# Patient Record
Sex: Female | Born: 1970 | Race: White | Hispanic: No | Smoking: Never smoker
Health system: Southern US, Community
[De-identification: ages and names within clinical notes are randomized; demographics above are authoritative.]

---

## 2013-04-08 ENCOUNTER — Telehealth (INDEPENDENT_AMBULATORY_CARE_PROVIDER_SITE_OTHER): Payer: Self-pay | Admitting: Family Medicine

## 2013-04-08 ENCOUNTER — Ambulatory Visit (INDEPENDENT_AMBULATORY_CARE_PROVIDER_SITE_OTHER): Payer: BC Managed Care – PPO | Admitting: Family Medicine

## 2013-04-08 ENCOUNTER — Encounter (INDEPENDENT_AMBULATORY_CARE_PROVIDER_SITE_OTHER): Payer: Self-pay | Admitting: Family Medicine

## 2013-04-08 VITALS — BP 166/52 | HR 83 | Temp 98.8°F | Resp 20 | Ht 68.0 in | Wt 170.0 lb

## 2013-04-08 MED ORDER — ELETRIPTAN HYDROBROMIDE 40 MG PO TABS
40.0000 mg | ORAL_TABLET | Freq: Every day | ORAL | Status: DC | PRN
Start: 2013-04-08 — End: 2013-04-11

## 2013-04-08 MED ORDER — SUMATRIPTAN SUCCINATE 100 MG PO TABS
100.0000 mg | ORAL_TABLET | ORAL | Status: DC | PRN
Start: 2013-04-08 — End: 2013-04-08

## 2013-04-08 NOTE — Telephone Encounter (Signed)
See note

## 2013-04-08 NOTE — Progress Notes (Signed)
Since your last visit, have you seen any new specialist or physicians?    no

## 2013-04-08 NOTE — Telephone Encounter (Signed)
Message copied by Roger Kill on Tue Apr 08, 2013  4:31 PM  ------       Message from: Miguel Aschoff       Created: Tue Apr 08, 2013  4:20 PM       Regarding: Medication Changes       Contact: [phone]         Patient was seen today by Dr. Otilio Jefferson for migraines. The SUMAtriptan (IMITREX) 100 MG tablet medication she was given was $100+ dollars and she would like to know if there is a medication that is cheaper that she can get through her insurance.              Please advise              Call patient       [phone]

## 2013-04-08 NOTE — Progress Notes (Signed)
Subjective:       Patient ID: Beverly Fowler is a 42 y.o. female.      Chief Complaint   Patient presents with   . Migraine     1 month and a half, pain on front left side of head     Pt here with boyfriend.       Migraine   This is a recurrent problem. The current episode started more than 1 month ago. The problem occurs intermittently (3x/wk). The problem has been waxing and waning. The pain is located in the left unilateral region. The pain radiates to the left neck. The pain quality is similar to prior headaches. The quality of the pain is described as throbbing, aching and stabbing (ice pick behind eye). The pain is moderate. Associated symptoms include nausea, phonophobia and photophobia. Pertinent negatives include no abdominal pain, abnormal behavior, anorexia, back pain, blurred vision, coughing, dizziness, drainage, ear pain, eye pain, eye redness, eye watering, facial sweating, fever, hearing loss, insomnia, loss of balance, muscle aches, numbness, rhinorrhea, scalp tenderness, sinus pressure, sore throat, tingling, tinnitus, visual change, vomiting, weakness or weight loss. The symptoms are aggravated by bright light and activity. She has tried NSAIDs, cold packs and darkened room (ibuprofen 800mg  BID prn) for the symptoms. The treatment provided moderate relief. Her past medical history is significant for migraines in the family (son during HS). There is no history of hypertension, migraine headaches, recent head traumas, sinus disease or TMJ.   Missing work due to HA.  In October and November, missed 6 full days and late for about 6 days as well.       The following portions of the patient's history were reviewed and updated as appropriate: allergies, current medications, past family history, past medical history, past social history, past surgical history and problem list.    There is no problem list on file for this patient.    History reviewed. No pertinent past medical history.  Past Surgical  History   Procedure Date   . Cesarean section 1992     Family History   Problem Relation Age of Onset   . Hypertension Mother      History     Social History   . Marital Status: Divorced     Spouse Name: N/A     Number of Children: N/A   . Years of Education: N/A     Occupational History   . Not on file.     Social History Main Topics   . Smoking status: Never Smoker    . Smokeless tobacco: Never Used   . Alcohol Use: Yes      Comment: socially   . Drug Use: No   . Sexually Active: Not on file     Other Topics Concern   . Not on file     Social History Narrative   . No narrative on file     Current Outpatient Prescriptions   Medication Sig Dispense Refill   . ibuprofen (ADVIL) 200 MG tablet Take 200 mg by mouth every 6 (six) hours as needed.       . norethindrone-ethinyl estradiol (TRIPHASIL) 0.5/0.75/1-35 MG-MCG per tablet Take 1 tablet by mouth daily.         No Known Allergies      Review of Systems   Constitutional: Positive for activity change and appetite change. Negative for fever, chills, weight loss and fatigue.   HENT: Negative for congestion, ear pain, hearing loss, postnasal  drip, rhinorrhea, sinus pressure, sore throat, tinnitus and trouble swallowing.    Eyes: Positive for photophobia. Negative for blurred vision, pain, discharge, redness, itching and visual disturbance.   Respiratory: Negative for cough and shortness of breath.    Gastrointestinal: Positive for nausea. Negative for vomiting, abdominal pain, diarrhea and anorexia.   Musculoskeletal: Negative for back pain.   Neurological: Positive for headaches. Negative for dizziness, tingling, tremors, syncope, facial asymmetry, speech difficulty, weakness, light-headedness, numbness and loss of balance.   Psychiatric/Behavioral: Positive for sleep disturbance. The patient does not have insomnia.      Filed Vitals:    04/08/13 1432   BP: 166/52   Pulse: 83   Temp: 98.8 F (37.1 C)   TempSrc: Oral   Resp: 20   Height: 1.727 m (5\' 8" )   Weight: 77.111  kg (170 lb)   SpO2: 97%           Objective:    Physical Exam   Vitals reviewed.  Constitutional: She appears well-developed and well-nourished. She does not have a sickly appearance. She appears ill.   HENT:   Right Ear: External ear and ear canal normal. Tympanic membrane is not bulging. A middle ear effusion is present.   Left Ear: External ear and ear canal normal. Tympanic membrane is not bulging. A middle ear effusion is present.   Nose: No mucosal edema. Right sinus exhibits no maxillary sinus tenderness and no frontal sinus tenderness. Left sinus exhibits no maxillary sinus tenderness and no frontal sinus tenderness.   Mouth/Throat: Uvula is midline, oropharynx is clear and moist and mucous membranes are normal. No oropharyngeal exudate, posterior oropharyngeal edema or posterior oropharyngeal erythema.   Eyes: Conjunctivae normal, EOM and lids are normal. Pupils are equal, round, and reactive to light.   Neck: Trachea normal, normal range of motion and full passive range of motion without pain. Neck supple. Carotid bruit is not present. No Brudzinski's sign and no Kernig's sign noted. No mass and no thyromegaly present.   Cardiovascular: Normal rate, regular rhythm and normal heart sounds.    No murmur heard.  Pulmonary/Chest: Effort normal and breath sounds normal. No respiratory distress.   Musculoskeletal: Normal range of motion.   Lymphadenopathy:        Head (right side): No submental, no submandibular, no preauricular and no posterior auricular adenopathy present.        Head (left side): No submental, no submandibular, no preauricular and no posterior auricular adenopathy present.     She has no cervical adenopathy.   Neurological: She has normal strength. She displays no tremor. No cranial nerve deficit. She displays a negative Romberg sign. Coordination and gait normal. She displays no Babinski's sign on the right side. She displays no Babinski's sign on the left side.   Reflex Scores:       Tricep  reflexes are 2+ on the left side.       Bicep reflexes are 2+ on the right side and 2+ on the left side.       Brachioradialis reflexes are 2+ on the right side and 2+ on the left side.       Patellar reflexes are 2+ on the right side and 2+ on the left side.       Achilles reflexes are 2+ on the right side and 2+ on the left side.  Psychiatric: She has a normal mood and affect.           Assessment:  1. Migraine headache   SUMAtriptan (IMITREX) 100 MG tablet           Plan:       Likely migraine headache.  Declines anti-emetic.   Try triptan prn. Risk & Benefits of the new medication(s) were explained to the patient (and family) who verbalized understanding & agreed to the treatment plan. Patient (family) encouraged to contact me/clinical staff with any questions/concerns  Discussed s/sx's to watch out for that would warrant immediate re-evaluation.  FU if symptoms worsen/persist.   Monitor BP.  Pt w/o hx of elevated BP.   Pt says may need work form completed due to missed day from this recurrent headache.      Kaye Luoma T. Matilde Haymaker, MD

## 2013-04-08 NOTE — Telephone Encounter (Signed)
New rx sent for Relpax.  Might be cheaper.  She can also pick up a discount card that she can use for a $10 copay.

## 2013-04-09 ENCOUNTER — Encounter (INDEPENDENT_AMBULATORY_CARE_PROVIDER_SITE_OTHER): Payer: Self-pay | Admitting: Family Medicine

## 2013-04-09 NOTE — Telephone Encounter (Signed)
Called pt and told her about Rx being sent into pharmacy and discount card.  Pt would like to know if she would be able to get a discount card for the next time the Rx needs to be refilled and will it work every time she picks up her Rx?  Pleas advise.

## 2013-04-09 NOTE — Telephone Encounter (Signed)
There are different conditions for these kinds of savings cards, but usually cover for refills up to one year (once a month).

## 2013-04-09 NOTE — Telephone Encounter (Signed)
Called pt and pt has been informed of doctors note via VM.  Awaiting call back.

## 2013-04-10 ENCOUNTER — Encounter (INDEPENDENT_AMBULATORY_CARE_PROVIDER_SITE_OTHER): Payer: Self-pay | Admitting: Family Medicine

## 2013-04-10 NOTE — Telephone Encounter (Signed)
Called pt and pt complied with the research of obtained a medication formulary from her insurance.

## 2013-04-10 NOTE — Telephone Encounter (Signed)
Called pharmacy seeking to see if we can find out what Rx. Is paid for by insurance company.    1. Amatrix  2. Naratrittan  3. Resatriptin-magzolt

## 2013-04-10 NOTE — Telephone Encounter (Signed)
Advise pt to obtain her insurance drug formulary and find out what migraine meds are covered/preferred by her insurance, then let us know (she can list the meds in a MyChart message).  She can also ask her pharmacy to fax [place] a list of preferred meds dictated by her insurance company.   Please get PA form from her insurance for review and completion.

## 2013-04-10 NOTE — Telephone Encounter (Signed)
Insurance denied Rx because doctor needed to authorize that this medication is the only med that pt can take.  Pharmacy will not process Rx. Until doctor authorizes it. Discount card can not be used until authorization is competed by doctor.  Pharmacy states that they would reach out to doctor for authorization.  Insurance will only pay for 6 per month.

## 2013-04-10 NOTE — Telephone Encounter (Signed)
Called [facility] to start case on Rx. Relpax 40mg .  Case # [id number]

## 2013-04-11 ENCOUNTER — Encounter (INDEPENDENT_AMBULATORY_CARE_PROVIDER_SITE_OTHER): Payer: Self-pay | Admitting: Family Medicine

## 2013-04-11 ENCOUNTER — Telehealth (INDEPENDENT_AMBULATORY_CARE_PROVIDER_SITE_OTHER): Payer: Self-pay | Admitting: Family Medicine

## 2013-04-11 MED ORDER — NARATRIPTAN HCL 2.5 MG PO TABS
ORAL_TABLET | ORAL | Status: DC
Start: 2013-04-11 — End: 2013-04-14

## 2013-04-11 NOTE — Telephone Encounter (Signed)
Called pt and advised them of doctors results/notes.  Encounter closed.

## 2013-04-11 NOTE — Telephone Encounter (Signed)
Message copied by Roger Kill on Fri Apr 11, 2013  9:14 AM  ------       Message from: Lambert Keto PATRICI       Created: Tue Apr 08, 2013  6:09 PM         Please call pt on Friday, 04/11/13 to ask she is doing re: headache and med. Advise BP check as well since BP during OV elevated.

## 2013-04-11 NOTE — Telephone Encounter (Signed)
Called pt to ask how headaches are doing no answer.  Left message for pt to contact me back via my chart.

## 2013-04-11 NOTE — Telephone Encounter (Signed)
Pt still needing to start medication.   Just advise pt to send MyChart with update next wk.   May close this encounter.

## 2013-04-11 NOTE — Telephone Encounter (Signed)
Please inform pt that drug formulary reviewed.    Tier 1 meds include naratriptan and rizatriptan ODT (and zolmitrptan ODT but like would require trial of other meds first).  Will try naratriptan first.  Rx sent to pharmacy.

## 2013-04-11 NOTE — Telephone Encounter (Signed)
Called pt and advised them of doctors results/notes.

## 2013-04-14 MED ORDER — AMITRIPTYLINE HCL 25 MG PO TABS
25.0000 mg | ORAL_TABLET | Freq: Every evening | ORAL | Status: DC
Start: 2013-04-14 — End: 2013-05-07

## 2013-04-29 ENCOUNTER — Encounter (INDEPENDENT_AMBULATORY_CARE_PROVIDER_SITE_OTHER): Payer: Self-pay | Admitting: Family Medicine

## 2013-04-29 ENCOUNTER — Telehealth (INDEPENDENT_AMBULATORY_CARE_PROVIDER_SITE_OTHER): Payer: Self-pay | Admitting: Family Medicine

## 2013-04-29 MED ORDER — SUMATRIPTAN SUCCINATE 100 MG PO TABS
100.0000 mg | ORAL_TABLET | ORAL | Status: DC | PRN
Start: 2013-04-29 — End: 2013-05-19

## 2013-04-29 NOTE — Telephone Encounter (Signed)
Sent in sum[facility]riptan to [facility] as requested. Dr. Matilde Haymaker will return next week and she can discuss FMLA paperwork with her at that time.

## 2013-04-29 NOTE — Telephone Encounter (Signed)
Please see note below sent from pt my chart.        Prescription Question       Burna Forts       Sent: Tue April 29, 2013  1:05 PM     To: [facility]          ZULY BELKIN     MRN: [id number] DOB: 07-05-1970     Pt Home: [phone]         Entered: [phone]              Message      Hello,     After much leg work and trying to find a pharmacy that I can reasonably afford with the options that you have given to me... I may have found a solution.      I did not fill any prescription prior due to cost, however, [facility] in [place] is fairly reasonable for the Sumatriptan prescription that was originally written for me, If I recall it was Sumatriptan, 100mg , 10 tabs. If you would be so kind as to call this in to that pharmacy so that I can get this filled, I have had little relief with Excedrin migraine to get me through. ALSO, I would like to take a moment to speak with Dr. Matilde Haymaker briefly about a form that needs to be completed for my employer for FMLA/accomodations.            Thanks!!     Velna Hatchet

## 2013-04-29 NOTE — Telephone Encounter (Signed)
PT informed of doctors note.

## 2013-05-05 ENCOUNTER — Encounter (INDEPENDENT_AMBULATORY_CARE_PROVIDER_SITE_OTHER): Payer: Self-pay | Admitting: Family Medicine

## 2013-05-07 ENCOUNTER — Ambulatory Visit (INDEPENDENT_AMBULATORY_CARE_PROVIDER_SITE_OTHER): Payer: BC Managed Care – PPO | Admitting: Family Medicine

## 2013-05-07 ENCOUNTER — Encounter (INDEPENDENT_AMBULATORY_CARE_PROVIDER_SITE_OTHER): Payer: Self-pay | Admitting: Family Medicine

## 2013-05-07 VITALS — BP 134/90 | HR 72 | Temp 98.7°F | Ht 60.0 in | Wt 167.0 lb

## 2013-05-07 DIAGNOSIS — G43909 Migraine, unspecified, not intractable, without status migrainosus: Secondary | ICD-10-CM

## 2013-05-07 NOTE — Progress Notes (Signed)
Since your last visit, have you seen any new specialist or physicians?    no

## 2013-05-07 NOTE — Progress Notes (Signed)
Subjective:       Patient ID: Beverly Fowler is a 43 y.o. female.    Chief Complaint   Patient presents with   . Migraine     f/u         HPI    Pt here for FU of migraine headaches, took sumatriptan 5 days ago and had helped after 2 doses.    Needs work form completed.  Was missing work days due to headaches, sometimes tardy. When with headache, unable to think clearly, difficult to verbalize thoughts, slower mentally and physically.  Needs to close eyes and stay in a dark and quiet environment, and rest/sleep.    Currently, pt has not headache.       The following portions of the patient's history were reviewed and updated as appropriate: allergies, current medications, past medical history, past social history, past surgical history and problem list.    Review of Systems   Constitutional: Negative for activity change, appetite change and fatigue.   Eyes: Negative for visual disturbance.   Neurological: Negative for weakness, light-headedness, numbness and headaches.   Psychiatric/Behavioral: Negative for sleep disturbance.         Filed Vitals:    05/07/13 1405   BP: 134/90   Pulse: 72   Temp: 98.7 F (37.1 C)   TempSrc: Oral   Height: 1.524 m (5')   Weight: 75.751 kg (167 lb)           Objective:    Physical Exam   Vitals reviewed.  Constitutional: She appears well-developed and well-nourished. She does not appear ill.   Cardiovascular: Normal rate, regular rhythm and normal heart sounds.    No murmur heard.  Pulmonary/Chest: Effort normal and breath sounds normal. No respiratory distress.   Neurological: She is alert.   Psychiatric: She has a normal mood and affect. Her speech is normal and behavior is normal. Judgment normal.           Assessment:       1. Migraine headache            Plan:       Continue sumatriptan prn.  Discussed indications for prophylactic migraine tx.  Advised to keep a headache diary, to identify triggers.  Work form completed with pt. Returned to pt.     Jeremey Bascom T. Matilde Haymaker,  MD

## 2013-05-09 ENCOUNTER — Encounter (INDEPENDENT_AMBULATORY_CARE_PROVIDER_SITE_OTHER): Payer: Self-pay | Admitting: Family Medicine

## 2013-05-19 ENCOUNTER — Other Ambulatory Visit (INDEPENDENT_AMBULATORY_CARE_PROVIDER_SITE_OTHER): Payer: Self-pay | Admitting: Internal Medicine

## 2013-05-19 DIAGNOSIS — G43909 Migraine, unspecified, not intractable, without status migrainosus: Secondary | ICD-10-CM

## 2013-05-22 ENCOUNTER — Encounter (INDEPENDENT_AMBULATORY_CARE_PROVIDER_SITE_OTHER): Payer: Self-pay | Admitting: Family Medicine

## 2013-07-07 ENCOUNTER — Encounter (INDEPENDENT_AMBULATORY_CARE_PROVIDER_SITE_OTHER): Payer: BC Managed Care – PPO | Admitting: Obstetrics & Gynecology

## 2013-07-23 ENCOUNTER — Encounter (INDEPENDENT_AMBULATORY_CARE_PROVIDER_SITE_OTHER): Payer: BC Managed Care – PPO | Admitting: Obstetrics & Gynecology

## 2014-03-21 ENCOUNTER — Ambulatory Visit: Payer: Self-pay | Admitting: General Practice

## 2014-04-06 DIAGNOSIS — S83239A Complex tear of medial meniscus, current injury, unspecified knee, initial encounter: Secondary | ICD-10-CM | POA: Insufficient documentation

## 2014-04-06 DIAGNOSIS — S83249A Other tear of medial meniscus, current injury, unspecified knee, initial encounter: Secondary | ICD-10-CM | POA: Insufficient documentation

## 2014-04-14 ENCOUNTER — Ambulatory Visit: Payer: Self-pay | Admitting: Surgery

## 2014-04-15 DIAGNOSIS — M1710 Unilateral primary osteoarthritis, unspecified knee: Secondary | ICD-10-CM | POA: Insufficient documentation

## 2014-04-15 DIAGNOSIS — M179 Osteoarthritis of knee, unspecified: Secondary | ICD-10-CM | POA: Insufficient documentation

## 2014-08-26 NOTE — Op Note (Signed)
PATIENT NAME:  Alicia Dalton, Alicia Dalton MR#:  [id number] DATE OF BIRTH:  12/07/1970  DATE OF PROCEDURE:  04/14/2014  PREOPERATIVE DIAGNOSIS:  Medial meniscus tear right knee.    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    PROCEDURE:  Arthroscopic partial medial meniscectomy and abrasion chondroplasty of grade 2 chondromalacial changes of the patella.    SURGEON:  Maryagnes AmosJ. Jeffrey Dorotea Hand, MD.    ANESTHESIA:  General LMA.    FINDINGS:  As noted above.  There was Dalton complex tear of the posteromedial portion of the medial meniscus primarily involving the white-white zone and therefore un-repairable.  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  The anterior and posterior cruciate ligament were both in excellent condition.  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  The lateral compartment was in excellent condition.    COMPLICATIONS:  None.    ESTIMATED BLOOD LOSS:  Minimal.    TOTAL FLUIDS:  500 mL crystalloid.    TOURNIQUET:  None.    DRAINS:  None.    CLOSURE:  3-0 Prolene interrupted sutures.    BRIEF CLINICAL NOTE:  The patient is Dalton 44 year old female with Dalton several month history of medial-sided right knee pain.  Her symptoms have persisted despite medications, activity modification, etc.  Her history and examination were consistent with Dalton medial meniscus tear confirmed by MRI scan.  She presents at this time for arthroscopy and debridement versus repair of the medial meniscus tear.    DESCRIPTION OF PROCEDURE:  The patient was brought into the operating room and lain in the supine position.  After adequate general laryngeal mask anesthesia was obtained, the patient's right knee was injected sterilely using Dalton solution of 30 mL of 0.5% Marcaine with epinephrine and 30 mL of 1%  lidocaine.  The patient's right lower extremity was prepped with Chloraprep solution before being draped sterilely.  Preoperative antibiotics were administered.  The expected portal sites were injected with 1% lidocaine with epinephrine before the camera was placed in the anterolateral portal and instrumentation performed through the anteromedial portal.  The knee was sequentially examined beginning in the suprapatellar pouch, then progressing to the patellofemoral space, the medial gutter and compartment, the notch, and finally the lateral compartment and gutter.  The findings were as described above.  Some reactive synovial tissues anteriorly were debrided using the full radius resector in order to improve visualization.  The medial meniscus was carefully probed, confirming the above noted findings.  It was not deemed repairable as it was primarily in the white-white zone.  Therefore, the torn portion of meniscus was debrided back to stable margins using Dalton combination of the straight and up-biting small baskets and 3.5 mm full radius resector.  Subsequent probing of the remaining rim demonstrated good stability.  Laterally, the meniscus was notable for Dalton discoid configuration, but there were no tears, so nothing further was done with this meniscus.  There was an area of grade 2 chondromalacia involving the anterolateral portion of the medial femoral condyle which was slightly abraded back to stable margins using the full radius resector.  There also was more extensive grade 2 to 3 chondromalacia involving the central ridge of the patella which also was debrided back to stable margins using the full radius resector.  The instruments were removed from the joint after suctioning  the excess fluid.  The portal sites were reapproximated using 3-0 Prolene interrupted sutures.  Dalton sterile bulky dressing was applied to the knee before the patient was awakened, extubated, and returned to the recovery room in satisfactory  condition after tolerating the procedure well.     ____________________________ J. Derald Macleod, MD jjp:bu D: 04/14/2014 18:38:18 ET T: 04/14/2014 20:36:06 ET JOB#: [id number]  cc: Derryl Harbor, MD, <Dictator> JEFF Fidel Levy MD ELECTRONICALLY SIGNED 05/12/2014 11:29

## 2014-10-07 ENCOUNTER — Telehealth (INDEPENDENT_AMBULATORY_CARE_PROVIDER_SITE_OTHER): Payer: Self-pay | Admitting: Obstetrics and Gynecology

## 2014-10-07 NOTE — Telephone Encounter (Signed)
Younique I CALLED NUMBER BELOW AND IT NOT CORRECT NUMBER.  THIS CHART WE HAVE NEVER SEEN THIS PT EITHER.  NOT SURE IF YOU HAVE THE RIGHT CHART.

## 2014-10-07 NOTE — Telephone Encounter (Signed)
Oops wrong chart, so sorry,

## 2014-10-07 NOTE — Telephone Encounter (Signed)
Patient called please call her at [phone].  She said that her bleeding has started again and she is passing clots.

## 2015-05-24 ENCOUNTER — Ambulatory Visit (INDEPENDENT_AMBULATORY_CARE_PROVIDER_SITE_OTHER): Payer: BC Managed Care – PPO | Admitting: Physician Assistant

## 2015-05-24 ENCOUNTER — Encounter: Payer: Self-pay | Admitting: Physician Assistant

## 2015-05-24 VITALS — BP 112/60 | HR 64 | Temp 98.3°F | Resp 16 | Ht 60.0 in | Wt 189.2 lb

## 2015-05-24 DIAGNOSIS — G4700 Insomnia, unspecified: Secondary | ICD-10-CM | POA: Diagnosis not present

## 2015-05-24 DIAGNOSIS — F410 Panic disorder [episodic paroxysmal anxiety] without agoraphobia: Secondary | ICD-10-CM | POA: Diagnosis not present

## 2015-05-24 DIAGNOSIS — G43909 Migraine, unspecified, not intractable, without status migrainosus: Secondary | ICD-10-CM | POA: Insufficient documentation

## 2015-05-24 DIAGNOSIS — F419 Anxiety disorder, unspecified: Secondary | ICD-10-CM

## 2015-05-24 MED ORDER — ALPRAZOLAM 0.5 MG PO TABS: ORAL_TABLET | ORAL | Status: DC

## 2015-05-24 NOTE — Progress Notes (Signed)
Patient: Alicia Dalton, Female    DOB: 06-19-1970, 45 y.o.   MRN: [id number] Visit Date: 05/24/2015  Today's Provider: Margaretann Loveless, PA-C   Chief Complaint  Patient presents with  . Establish Care  . Panic Attack   Subjective:    Establish Care:  Alicia Dalton is a 45 y.o. female who presents today as a new patient to established care. She feels well. She reports exercising-elliptical 3-4 days a week . She reports she is sleeping poorly-stress, will like something to help her sleep better. Patient had her pap smear and mammogram May 2016 and Tetanus Oct. 2015. [facility] and [facility]. She got her Influenza Vaccine through work, works at [employer].  Patient states has been feeling anxious and nervous lately.  Anxiety: Patient complains of panic attacks.  She has the following symptoms: chest pain, difficulty concentrating, fatigue, insomnia, irritable, palpitations, racing thoughts, shortness of breath. Onset of symptoms was approximately 3 month ago, gradually worsening since that time. She denies current suicidal and homicidal ideation. Family history significant for substance abuse father is an alcoholic Risk factors: none Previous treatment includes none. She complains of the following side effects from the treatment: none. Per patient this is something new, She does not know how to explain. She has been feeling family stress. She states that her father was a terrible alcoholic and that she is estranged herself from her family secondary to this. She states that her current live-in boyfriend has started to drink more alcohol as well and this is caused a rift in their relationship. She states that he is not abusive but that when he drinks he can be belligerent. She states that this is causing her her most stress and anxiety as she never knows what she is going to be coming home to.   Review of Systems  Constitutional: Negative.   HENT: Negative.     Eyes: Negative.   Respiratory: Negative.   Cardiovascular: Negative.   Gastrointestinal: Negative.   Endocrine: Negative.   Genitourinary: Negative.   Musculoskeletal: Negative.   Skin: Negative.   Allergic/Immunologic: Negative.   Neurological: Negative.   Hematological: Negative.   Psychiatric/Behavioral: Positive for sleep disturbance, dysphoric mood and decreased concentration. Negative for suicidal ideas, self-injury and agitation. The patient is nervous/anxious.     Social History      She  reports that she has never smoked. She does not have any smokeless tobacco history on file. She reports that she drinks alcohol. She reports that she does not use illicit drugs.       Social History   Social History  . Marital Status: Divorced    Spouse Name: N/A  . Number of Children: N/A  . Years of Education: N/A   Social History Main Topics  . Smoking status: Never Smoker   . Smokeless tobacco: None  . Alcohol Use: Yes     Comment: 1-2 x wk  . Drug Use: No  . Sexual Activity: Yes   Other Topics Concern  . None   Social History Narrative  . None    History reviewed. No pertinent past medical history.   Patient Active Problem List   Diagnosis Date Noted  . Headache, migraine 05/24/2015  . Arthritis of knee, degenerative 04/15/2014  . Complex tear of medial meniscus as current injury 04/06/2014  . Current tear knee, medial meniscus 04/06/2014    History reviewed. No pertinent past surgical history.  Family History  Family Status  Relation Status Death Age  . Mother Alive   . Father Alive         Her family history includes Alcohol abuse in her father.    No Known Allergies  Previous Medications   NORETHINDRONE-ETHINYL ESTRADIOL 1/35 (NECON 1/35, 28,) TABLET       SUMATRIPTAN (IMITREX) 100 MG TABLET        Patient Care Team: Margaretann Loveless, PA-C as PCP - General (Family Medicine)     Objective:   Vitals: BP 112/60 mmHg  Pulse 64   Temp(Src) 98.3 F (36.8 C) (Oral)  Resp 16  Ht 5' (1.524 m)  Wt 189 lb 3.2 oz (85.821 kg)  BMI 36.95 kg/m2  LMP 05/07/2014   Physical Exam  Constitutional: She appears well-developed and well-nourished. No distress.  Cardiovascular: Normal rate, regular rhythm and normal heart sounds.  Exam reveals no gallop and no friction rub.   No murmur heard. Pulmonary/Chest: Effort normal and breath sounds normal. No respiratory distress. She has no wheezes. She has no rales.  Skin: She is not diaphoretic.  Psychiatric: Her speech is normal and behavior is normal. Judgment and thought content normal. Her mood appears anxious. Cognition and memory are normal. She exhibits a depressed mood.  Easily tearful  Vitals reviewed.    Depression Screen No flowsheet data found.    Assessment & Plan:     Routine Health Maintenance and Physical Exam  1. Acute anxiety Most likely secondary to situational anxiety associated with her live-in boyfriend's alcohol issue. I will give Xanax as below to help with sleep and for her to have if she needs during an acute panic attack. I will also check labs as below and follow-up with her pending these lab results. I have also made a referral to psychology as I feel she would benefit greatly from counseling and coping mechanisms. I will see her back in approximately 4 weeks to evaluate how she is doing at the time. She is to call the office if she has any worsening symptoms in the meantime. - Ambulatory referral to Psychology - ALPRAZolam (XANAX) 0.5 MG tablet; May take 1/2 tab to 1 tab PO BID prn for panic attack and take 1 tab PO q h.s. Prn sleep  Dispense: 60 tablet; Refill: 0 - CBC With Differential - Comprehensive Metabolic Panel (CMET) - TSH  2. Panic attacks See above medical treatment plan. - TSH  3. Insomnia See above medical treatment plan. - Comprehensive Metabolic Panel (CMET) - TSH   Exercise Activities and Dietary recommendations Goals     None       There is no immunization history on file for this patient.  Health Maintenance  Topic Date Due  . HIV Screening  03/17/1985  . TETANUS/TDAP  03/17/1989  . PAP SMEAR  03/18/1991  . INFLUENZA VACCINE  11/30/2015      Discussed health benefits of physical activity, and encouraged her to engage in regular exercise appropriate for her age and condition.    --------------------------------------------------------------------

## 2015-05-24 NOTE — Patient Instructions (Signed)
Alprazolam tablets What is this medicine? ALPRAZOLAM (al PRAY zoe lam) is a benzodiazepine. It is used to treat anxiety and panic attacks. This medicine may be used for other purposes; ask your health care provider or pharmacist if you have questions. What should I tell my health care provider before I take this medicine? They need to know if you have any of these conditions: -an alcohol or drug abuse problem -bipolar disorder, depression, psychosis or other mental health conditions -glaucoma -kidney or liver disease -lung or breathing disease -myasthenia gravis -Parkinson's disease -porphyria -seizures or a history of seizures -suicidal thoughts -an unusual or allergic reaction to alprazolam, other benzodiazepines, foods, dyes, or preservatives -pregnant or trying to get pregnant -breast-feeding How should I use this medicine? Take this medicine by mouth with a glass of water. Follow the directions on the prescription label. Take your medicine at regular intervals. Do not take it more often than directed. If you have been taking this medicine regularly for some time, do not suddenly stop taking it. You must gradually reduce the dose or you may get severe side effects. Ask your doctor or health care professional for advice. Even after you stop taking this medicine it can still affect your body for several days. Talk to your pediatrician regarding the use of this medicine in children. Special care may be needed. Overdosage: If you think you have taken too much of this medicine contact a poison control center or emergency room at once. NOTE: This medicine is only for you. Do not share this medicine with others. What if I miss a dose? If you miss a dose, take it as soon as you can. If it is almost time for your next dose, take only that dose. Do not take double or extra doses. What may interact with this medicine? Do not take this medicine with any of the following medications: -certain  medicines for HIV infection or AIDS -ketoconazole -itraconazole This medicine may also interact with the following medications: -birth control pills -certain macrolide antibiotics like clarithromycin, erythromycin, troleandomycin -cimetidine -cyclosporine -ergotamine -grapefruit juice -herbal or dietary supplements like kava kava, melatonin, dehydroepiandrosterone, DHEA, St. John's Wort or valerian -imatinib, STI-571 -isoniazid -levodopa -medicines for depression, anxiety, or psychotic disturbances -prescription pain medicines -rifampin, rifapentine, or rifabutin -some medicines for blood pressure or heart problems -some medicines for seizures like carbamazepine, oxcarbazepine, phenobarbital, phenytoin, primidone This list may not describe all possible interactions. Give your health care provider a list of all the medicines, herbs, non-prescription drugs, or dietary supplements you use. Also tell them if you smoke, drink alcohol, or use illegal drugs. Some items may interact with your medicine. What should I watch for while using this medicine? Visit your doctor or health care professional for regular checks on your progress. Your body can become dependent on this medicine. Ask your doctor or health care professional if you still need to take it. You may get drowsy or dizzy. Do not drive, use machinery, or do anything that needs mental alertness until you know how this medicine affects you. To reduce the risk of dizzy and fainting spells, do not stand or sit up quickly, especially if you are an older patient. Alcohol may increase dizziness and drowsiness. Avoid alcoholic drinks. Do not treat yourself for coughs, colds or allergies without asking your doctor or health care professional for advice. Some ingredients can increase possible side effects. What side effects may I notice from receiving this medicine? Side effects that you should report to your   doctor or health care professional as  soon as possible: -allergic reactions like skin rash, itching or hives, swelling of the face, lips, or tongue -confusion, forgetfulness -depression -difficulty sleeping -difficulty speaking -feeling faint or lightheaded, falls -mood changes, excitability or aggressive behavior -muscle cramps -trouble passing urine or change in the amount of urine -unusually weak or tired Side effects that usually do not require medical attention (report to your doctor or health care professional if they continue or are bothersome): -change in sex drive or performance -changes in appetite This list may not describe all possible side effects. Call your doctor for medical advice about side effects. You may report side effects to FDA at 1-800-FDA-1088. Where should I keep my medicine? Keep out of the reach of children. This medicine can be abused. Keep your medicine in a safe place to protect it from theft. Do not share this medicine with anyone. Selling or giving away this medicine is dangerous and against the law. Store at room temperature between 20 and 25 degrees C (68 and 77 degrees F). This medicine may cause accidental overdose and death if taken by other adults, children, or pets. Mix any unused medicine with a substance like cat litter or coffee grounds. Then throw the medicine away in a sealed container like a sealed bag or a coffee can with a lid. Do not use the medicine after the expiration date. NOTE: This sheet is a summary. It may not cover all possible information. If you have questions about this medicine, talk to your doctor, pharmacist, or health care provider.    2016, Elsevier/Gold Standard. (2014-01-06 14:51:36) Panic Attacks Panic attacks are sudden, short-livedsurges of severe anxiety, fear, or discomfort. They may occur for no reason when you are relaxed, when you are anxious, or when you are sleeping. Panic attacks may occur for a number of reasons:   Healthy people occasionally have  panic attacks in extreme, life-threatening situations, such as war or natural disasters. Normal anxiety is a protective mechanism of the body that helps [place] react to danger (fight or flight response).  Panic attacks are often seen with anxiety disorders, such as panic disorder, social anxiety disorder, generalized anxiety disorder, and phobias. Anxiety disorders cause excessive or uncontrollable anxiety. They may interfere with your relationships or other life activities.  Panic attacks are sometimes seen with other mental illnesses, such as depression and posttraumatic stress disorder.  Certain medical conditions, prescription medicines, and drugs of abuse can cause panic attacks. SYMPTOMS  Panic attacks start suddenly, peak within 20 minutes, and are accompanied by four or more of the following symptoms:  Pounding heart or fast heart rate (palpitations).  Sweating.  Trembling or shaking.  Shortness of breath or feeling smothered.  Feeling choked.  Chest pain or discomfort.  Nausea or strange feeling in your stomach.  Dizziness, light-headedness, or feeling like you will faint.  Chills or hot flushes.  Numbness or tingling in your lips or hands and feet.  Feeling that things are not real or feeling that you are not yourself.  Fear of losing control or going crazy.  Fear of dying. Some of these symptoms can mimic serious medical conditions. For example, you may think you are having a heart attack. Although panic attacks can be very scary, they are not life threatening. DIAGNOSIS  Panic attacks are diagnosed through an assessment by your health care provider. Your health care provider will ask questions about your symptoms, such as where and when they occurred. Your health care  provider will also ask about your medical history and use of alcohol and drugs, including prescription medicines. Your health care provider may order blood tests or other studies to rule out a serious  medical condition. Your health care provider may refer you to a mental health professional for further evaluation. TREATMENT   Most healthy people who have one or two panic attacks in an extreme, life-threatening situation will not require treatment.  The treatment for panic attacks associated with anxiety disorders or other mental illness typically involves counseling with a mental health professional, medicine, or a combination of both. Your health care provider will help determine what treatment is best for you.  Panic attacks due to physical illness usually go away with treatment of the illness. If prescription medicine is causing panic attacks, talk with your health care provider about stopping the medicine, decreasing the dose, or substituting another medicine.  Panic attacks due to alcohol or drug abuse go away with abstinence. Some adults need professional help in order to stop drinking or using drugs. HOME CARE INSTRUCTIONS   Take all medicines as directed by your health care provider.   Schedule and attend follow-up visits as directed by your health care provider. It is important to keep all your appointments. SEEK MEDICAL CARE IF:  You are not able to take your medicines as prescribed.  Your symptoms do not improve or get worse. SEEK IMMEDIATE MEDICAL CARE IF:   You experience panic attack symptoms that are different than your usual symptoms.  You have serious thoughts about hurting yourself or others.  You are taking medicine for panic attacks and have a serious side effect. MAKE SURE YOU:  Understand these instructions.  Will watch your condition.  Will get help right away if you are not doing well or get worse.   This information is not intended to replace advice given to you by your health care provider. Make sure you discuss any questions you have with your health care provider.   Document Released: 04/17/2005 Document Revised: 04/22/2013 Document Reviewed:  11/29/2012 Elsevier Interactive Patient Education [employer]

## 2015-05-25 ENCOUNTER — Telehealth: Payer: Self-pay

## 2015-05-25 LAB — COMPREHENSIVE METABOLIC PANEL
ALBUMIN: 4.3 g/dL (ref 3.5–5.5)
ALK PHOS: 78 IU/L (ref 39–117)
ALT: 18 IU/L (ref 0–32)
AST: 20 IU/L (ref 0–40)
Albumin/Globulin Ratio: 1.4 (ref 1.1–2.5)
BUN / CREAT RATIO: 14 (ref 9–23)
BUN: 11 mg/dL (ref 6–24)
CHLORIDE: 104 mmol/L (ref 96–106)
CO2: 21 mmol/L (ref 18–29)
Calcium: 9.5 mg/dL (ref 8.7–10.2)
Creatinine, Ser: 0.81 mg/dL (ref 0.57–1.00)
GFR calc Af Amer: 101 mL/min/{1.73_m2} (ref >59)
GFR calc non Af Amer: 88 mL/min/{1.73_m2} (ref >59)
GLUCOSE: 91 mg/dL (ref 65–99)
Globulin, Total: 3.1 g/dL (ref 1.5–4.5)
Potassium: 5.4 mmol/L — ABNORMAL HIGH (ref 3.5–5.2)
Sodium: 142 mmol/L (ref 134–144)
Total Protein: 7.4 g/dL (ref 6.0–8.5)

## 2015-05-25 LAB — CBC WITH DIFFERENTIAL
BASOS ABS: 0 10*3/uL (ref 0.0–0.2)
Basos: 0 %
EOS (ABSOLUTE): 0.1 10*3/uL (ref 0.0–0.4)
EOS: 1 %
HEMOGLOBIN: 13.4 g/dL (ref 11.1–15.9)
Hematocrit: 40 % (ref 34.0–46.6)
Immature Grans (Abs): 0 10*3/uL (ref 0.0–0.1)
Immature Granulocytes: 0 %
LYMPHS: 35 %
Lymphocytes Absolute: 3 10*3/uL (ref 0.7–3.1)
MCH: 30.8 pg (ref 26.6–33.0)
MCHC: 33.5 g/dL (ref 31.5–35.7)
MCV: 92 fL (ref 79–97)
MONOCYTES: 6 %
MONOS ABS: 0.5 10*3/uL (ref 0.1–0.9)
NEUTROS PCT: 58 %
Neutrophils Absolute: 4.8 10*3/uL (ref 1.4–7.0)
RBC: 4.35 x10E6/uL (ref 3.77–5.28)
RDW: 13.5 % (ref 12.3–15.4)
WBC: 8.4 10*3/uL (ref 3.4–10.8)

## 2015-05-25 LAB — TSH: TSH: 3.51 u[IU]/mL (ref 0.450–4.500)

## 2015-05-25 NOTE — Telephone Encounter (Signed)
-----   Message from Margaretann Loveless, PA-C sent at 05/25/2015  8:27 AM EST ----- All labs are within normal limits and stable.  Thanks! -JB

## 2015-05-25 NOTE — Telephone Encounter (Signed)
Pt advised.   Thanks.   -Alicia Dalton  

## 2015-06-09 ENCOUNTER — Ambulatory Visit: Payer: BC Managed Care – PPO | Admitting: Licensed Clinical Social Worker

## 2015-06-21 ENCOUNTER — Encounter: Payer: Self-pay | Admitting: Physician Assistant

## 2015-06-21 ENCOUNTER — Ambulatory Visit (INDEPENDENT_AMBULATORY_CARE_PROVIDER_SITE_OTHER): Payer: BC Managed Care – PPO | Admitting: Physician Assistant

## 2015-06-21 VITALS — BP 110/70 | Temp 98.4°F | Resp 16 | Wt 191.0 lb

## 2015-06-21 DIAGNOSIS — F411 Generalized anxiety disorder: Secondary | ICD-10-CM

## 2015-06-21 DIAGNOSIS — F32A Depression, unspecified: Secondary | ICD-10-CM

## 2015-06-21 DIAGNOSIS — F329 Major depressive disorder, single episode, unspecified: Secondary | ICD-10-CM

## 2015-06-21 MED ORDER — ALPRAZOLAM 1 MG PO TABS: 1.0000 mg | ORAL_TABLET | Freq: Three times a day (TID) | ORAL | Status: DC | PRN

## 2015-06-21 MED ORDER — CITALOPRAM HYDROBROMIDE 10 MG PO TABS: 10.0000 mg | ORAL_TABLET | Freq: Every day | ORAL | Status: DC

## 2015-06-21 NOTE — Progress Notes (Signed)
Patient: Alicia Dalton Female    DOB: 27-Feb-1971   45 y.o.   MRN: [id number] Visit Date: 06/21/2015  Today's Provider: Margaretann Loveless, PA-C   Chief Complaint  Patient presents with  . Follow-up    anxiety   Subjective:    HPI Anxiety: Patient is here for her 4 week follow-up acute anxiety.  She has the following symptoms: chest pain, difficulty concentrating, dizziness, fatigue, feelings of losing control, insomnia, irritable, palpitations, racing thoughts, shortness of breath. Onset of symptoms was approximately 1 day ago, gradually improving since that time, the Xanax is helping a little. Per patient 1/2 of the pills seem not to help much. She took a full one yesterday when she had a severe panic attak  . She denies current suicidal and homicidal ideation. Family history significant for no psychiatric illness.Risk factors: none. She complains of the following side effects from the treatment: makes her feel drowsy.. Patient was also referred to a psychologist on 01/23. She didn't go to the psychologist appointment. They called her from the psychologist office and told her that she needed to call her insurance to verify if the office visit was going to get cover.     No Known Allergies Previous Medications   ALPRAZOLAM (XANAX) 0.5 MG TABLET    May take 1/2 tab to 1 tab PO BID prn for panic attack and take 1 tab PO q h.s. Prn sleep   NORETHINDRONE-ETHINYL ESTRADIOL 1/35 (NECON 1/35, 28,) TABLET       SUMATRIPTAN (IMITREX) 100 MG TABLET        Review of Systems  Constitutional: Negative.   Respiratory: Negative.   Cardiovascular: Negative.  Negative for chest pain.  Gastrointestinal: Negative.   Psychiatric/Behavioral: Positive for dysphoric mood, decreased concentration and agitation (Just don't feel normal. Wants to feel normal again.). The patient is nervous/anxious.     Social History  Substance Use Topics  . Smoking status: Never Smoker   . Smokeless tobacco:  Not on file  . Alcohol Use: Yes     Comment: 1-2 x wk   Objective:   BP 110/70 mmHg  Temp(Src) 98.4 F (36.9 C) (Oral)  Resp 16  Wt 191 lb (86.637 kg)  LMP 06/11/2015  Physical Exam  Constitutional: She appears well-developed and well-nourished. No distress.  Cardiovascular: Normal rate, regular rhythm and normal heart sounds.  Exam reveals no gallop and no friction rub.   No murmur heard. Pulmonary/Chest: Effort normal and breath sounds normal. No respiratory distress. She has no wheezes. She has no rales.  Skin: She is not diaphoretic.  Psychiatric: Her speech is normal and behavior is normal. Judgment and thought content normal. Her mood appears anxious. Cognition and memory are normal. She exhibits a depressed mood.  Very tearful at times during exam, questioning will she ever feel normal again  Vitals reviewed.       Assessment & Plan:     1. Generalized anxiety disorder Symptoms not well controlled. Will increase xanax to  but for her to cut in half for the majority of the time and take one whole pill during the bad panic attacks.  She is going to call to get back in touch with [facility] for counseling. She will call if unable to get this rescheduled. Will add celexa for better daily control. She is to take this at night before bedtime. I will see her back in 4 weeks to re-evaluate. She is to call the office  if she has any adverse reactions to the medication, develops anyy questions or concerns.  - citalopram (CELEXA) 10 MG tablet; Take 1 tablet (10 mg total) by mouth daily.  Dispense: 30 tablet; Refill: 0 - ALPRAZolam (XANAX) 1 MG tablet; Take 1 tablet (1 mg total) by mouth 3 (three) times daily as needed for anxiety.  Dispense: 60 tablet; Refill: 0       Margaretann Loveless, PA-C  [facility]

## 2015-06-21 NOTE — Patient Instructions (Signed)
Citalopram tablets What is this medicine? CITALOPRAM (sye TAL oh pram) is a medicine for depression. This medicine may be used for other purposes; ask your health care provider or pharmacist if you have questions. What should I tell my health care provider before I take this medicine? They need to know if you have any of these conditions: -bipolar disorder or a family history of bipolar disorder -diabetes -glaucoma -heart disease -history of irregular heartbeat -kidney or liver disease -low levels of magnesium or potassium in the blood -receiving electroconvulsive therapy -seizures (convulsions) -suicidal thoughts or a previous suicide attempt -an unusual or allergic reaction to citalopram, escitalopram, other medicines, foods, dyes, or preservatives -pregnant or trying to become pregnant -breast-feeding How should I use this medicine? Take this medicine by mouth with a glass of water. Follow the directions on the prescription label. You can take it with or without food. Take your medicine at regular intervals. Do not take your medicine more often than directed. Do not stop taking this medicine suddenly except upon the advice of your doctor. Stopping this medicine too quickly may cause serious side effects or your condition may worsen. A special MedGuide will be given to you by the pharmacist with each prescription and refill. Be sure to read this information carefully each time. Talk to your pediatrician regarding the use of this medicine in children. Special care may be needed. Patients over 60 years old may have a stronger reaction and need a smaller dose. Overdosage: If you think you have taken too much of this medicine contact a poison control center or emergency room at once. NOTE: This medicine is only for you. Do not share this medicine with others. What if I miss a dose? If you miss a dose, take it as soon as you can. If it is almost time for your next dose, take only that dose.  Do not take double or extra doses. What may interact with this medicine? Do not take this medicine with any of the following medications: -certain medicines for fungal infections like fluconazole, itraconazole, ketoconazole, posaconazole, voriconazole -cisapride -dofetilide -dronedarone -escitalopram -linezolid -MAOIs like Carbex, Eldepryl, Marplan, Nardil, and Parnate -methylene blue (injected into a vein) -pimozide -thioridazine -ziprasidone This medicine may also interact with the following medications: -alcohol -aspirin and aspirin-like medicines -carbamazepine -certain medicines for depression, anxiety, or psychotic disturbances -certain medicines for infections like chloroquine, clarithromycin, erythromycin, furazolidone, isoniazid, pentamidine -certain medicines for migraine headaches like almotriptan, eletriptan, frovatriptan, naratriptan, rizatriptan, sumatriptan, zolmitriptan -certain medicines for sleep -certain medicines that treat or prevent blood clots like dalteparin, enoxaparin, warfarin -cimetidine -diuretics -fentanyl -lithium -methadone -metoprolol -NSAIDs, medicines for pain and inflammation, like ibuprofen or naproxen -omeprazole -other medicines that prolong the QT interval (cause an abnormal heart rhythm) -procarbazine -rasagiline -supplements like St. John's wort, kava kava, valerian -tramadol -tryptophan This list may not describe all possible interactions. Give your health care provider a list of all the medicines, herbs, non-prescription drugs, or dietary supplements you use. Also tell them if you smoke, drink alcohol, or use illegal drugs. Some items may interact with your medicine. What should I watch for while using this medicine? Tell your doctor if your symptoms do not get better or if they get worse. Visit your doctor or health care professional for regular checks on your progress. Because it may take several weeks to see the full effects of  this medicine, it is important to continue your treatment as prescribed by your doctor. Patients and their families should watch out   for new or worsening thoughts of suicide or depression. Also watch out for sudden changes in feelings such as feeling anxious, agitated, panicky, irritable, hostile, aggressive, impulsive, severely restless, overly excited and hyperactive, or not being able to sleep. If this happens, especially at the beginning of treatment or after a change in dose, call your health care professional. You may get drowsy or dizzy. Do not drive, use machinery, or do anything that needs mental alertness until you know how this medicine affects you. Do not stand or sit up quickly, especially if you are an older patient. This reduces the risk of dizzy or fainting spells. Alcohol may interfere with the effect of this medicine. Avoid alcoholic drinks. Your mouth may get dry. Chewing sugarless gum or sucking hard candy, and drinking plenty of water will help. Contact your doctor if the problem does not go away or is severe. What side effects may I notice from receiving this medicine? Side effects that you should report to your doctor or health care professional as soon as possible: -allergic reactions like skin rash, itching or hives, swelling of the face, lips, or tongue -chest pain -confusion -dizziness -fast, irregular heartbeat -fast talking and excited feelings or actions that are out of control -feeling faint or lightheaded, falls -hallucination, loss of contact with reality -seizures -shortness of breath -suicidal thoughts or other mood changes -unusual bleeding or bruising Side effects that usually do not require medical attention (report to your doctor or health care professional if they continue or are bothersome): -blurred vision -change in appetite -change in sex drive or performance -headache -increased sweating -nausea -trouble sleeping This list may not describe all  possible side effects. Call your doctor for medical advice about side effects. You may report side effects to FDA at 1-800-FDA-1088. Where should I keep my medicine? Keep out of reach of children. Store at room temperature between 15 and 30 degrees C (59 and 86 degrees F). Throw away any unused medicine after the expiration date. NOTE: This sheet is a summary. It may not cover all possible information. If you have questions about this medicine, talk to your doctor, pharmacist, or health care provider.    2016, Elsevier/Gold Standard. (2012-11-08 13:19:48)  

## 2015-06-24 ENCOUNTER — Encounter: Payer: Self-pay | Admitting: Physician Assistant

## 2015-07-19 ENCOUNTER — Ambulatory Visit: Payer: Self-pay | Admitting: Physician Assistant

## 2015-07-20 ENCOUNTER — Encounter: Payer: Self-pay | Admitting: Physician Assistant

## 2015-07-20 ENCOUNTER — Ambulatory Visit (INDEPENDENT_AMBULATORY_CARE_PROVIDER_SITE_OTHER): Payer: BC Managed Care – PPO | Admitting: Physician Assistant

## 2015-07-20 VITALS — BP 100/60 | HR 68 | Temp 98.3°F | Resp 16 | Wt 187.4 lb

## 2015-07-20 DIAGNOSIS — F411 Generalized anxiety disorder: Secondary | ICD-10-CM

## 2015-07-20 MED ORDER — CITALOPRAM HYDROBROMIDE 10 MG PO TABS: 10.0000 mg | ORAL_TABLET | Freq: Every day | ORAL | Status: DC

## 2015-07-20 MED ORDER — ALPRAZOLAM 1 MG PO TABS: 1.0000 mg | ORAL_TABLET | Freq: Three times a day (TID) | ORAL | Status: DC | PRN

## 2015-07-20 NOTE — Patient Instructions (Signed)
Generalized Anxiety Disorder Generalized anxiety disorder (GAD) is a mental disorder. It interferes with life functions, including relationships, work, and school. GAD is different from normal anxiety, which everyone experiences at some point in their lives in response to specific life events and activities. Normal anxiety actually helps us prepare for and get through these life events and activities. Normal anxiety goes away after the event or activity is over.  GAD causes anxiety that is not necessarily related to specific events or activities. It also causes excess anxiety in proportion to specific events or activities. The anxiety associated with GAD is also difficult to control. GAD can vary from mild to severe. People with severe GAD can have intense waves of anxiety with physical symptoms (panic attacks).  SYMPTOMS The anxiety and worry associated with GAD are difficult to control. This anxiety and worry are related to many life events and activities and also occur more days than not for 6 months or longer. People with GAD also have three or more of the following symptoms (one or more in children):  Restlessness.   Fatigue.  Difficulty concentrating.   Irritability.  Muscle tension.  Difficulty sleeping or unsatisfying sleep. DIAGNOSIS GAD is diagnosed through an assessment by your health care provider. Your health care provider will ask you questions aboutyour mood,physical symptoms, and events in your life. Your health care provider may ask you about your medical history and use of alcohol or drugs, including prescription medicines. Your health care provider may also do a physical exam and blood tests. Certain medical conditions and the use of certain substances can cause symptoms similar to those associated with GAD. Your health care provider may refer you to a mental health specialist for further evaluation. TREATMENT The following therapies are usually used to treat GAD:    Medication. Antidepressant medication usually is prescribed for long-term daily control. Antianxiety medicines may be added in severe cases, especially when panic attacks occur.   Talk therapy (psychotherapy). Certain types of talk therapy can be helpful in treating GAD by providing support, education, and guidance. A form of talk therapy called cognitive behavioral therapy can teach you healthy ways to think about and react to daily life events and activities.  Stress managementtechniques. These include yoga, meditation, and exercise and can be very helpful when they are practiced regularly. A mental health specialist can help determine which treatment is best for you. Some people see improvement with one therapy. However, other people require a combination of therapies.   This information is not intended to replace advice given to you by your health care provider. Make sure you discuss any questions you have with your health care provider.   Document Released: 08/12/2012 Document Revised: 05/08/2014 Document Reviewed: 08/12/2012 Elsevier Interactive Patient Education 2016 Elsevier Inc.  

## 2015-07-20 NOTE — Progress Notes (Signed)
Patient: Alicia Dalton Female    DOB: 06/14/1970   45 y.o.   MRN: [id number] Visit Date: 07/20/2015  Today's Provider: Margaretann Loveless, PA-C   Chief Complaint  Patient presents with  . Follow-up    Anxiety   Subjective:    HPI  Alicia Dalton is here for her 4 week follow-up. In the last office visit her symptoms were not controlled. Xanax was increased to 1 mg but to cut in half for the majority of time and to take 1 whole pill during a panic attacks. She was also prescribed Celexa. Patient was instructed to take at bedtime but she was waking up feeling very nauseated, light headed. Antony Contras advised patient through email to cut the pill in half for a couple of days to see if symptoms continue. She reports she is doing much better. Since the last office visit the panic attacks were two weeks ago. She feel she is able to controll the panic attacks better than before. She was having them more frequently but not any more. She does reports feeling tired and feels foggy. She usually starts with half of the pill but most of the time she needs to take the whole pill. Feels her concentration is still the same. She states that now she taking the whole pill of Celexa, after she did what Antony Contras advised her. Her symptoms got better. She also exercises 5 times a week. She started two weeks ago and reports that is helping her lot to focus and feel better in a way.     No Known Allergies Previous Medications   ALPRAZOLAM (XANAX) 1 MG TABLET    Take 1 tablet (1 mg total) by mouth 3 (three) times daily as needed for anxiety.   CITALOPRAM (CELEXA) 10 MG TABLET    Take 1 tablet (10 mg total) by mouth daily.   NORETHINDRONE-ETHINYL ESTRADIOL 1/35 (NECON 1/35, 28,) TABLET       SUMATRIPTAN (IMITREX) 100 MG TABLET        Review of Systems  Constitutional: Positive for fatigue (a lot).  Cardiovascular: Negative for chest pain, palpitations and leg swelling.  Neurological: Negative for  dizziness, light-headedness and headaches.  Psychiatric/Behavioral: Positive for sleep disturbance (sometimes.) and decreased concentration. Negative for suicidal ideas, dysphoric mood and agitation. The patient is nervous/anxious (much better.).     Social History  Substance Use Topics  . Smoking status: Never Smoker   . Smokeless tobacco: Not on file  . Alcohol Use: Yes     Comment: 1-2 x wk   Objective:   BP 100/60 mmHg  Pulse 68  Temp(Src) 98.3 F (36.8 C) (Oral)  Resp 16  Wt 187 lb 6.4 oz (85.004 kg)  LMP 07/11/2015  Physical Exam  Constitutional: She appears well-developed and well-nourished. No distress.  Cardiovascular: Normal rate, regular rhythm and normal heart sounds.  Exam reveals no gallop and no friction rub.   No murmur heard. Pulmonary/Chest: Effort normal and breath sounds normal. No respiratory distress. She has no wheezes. She has no rales.  Skin: She is not diaphoretic.  Psychiatric: She has a normal mood and affect. Her behavior is normal. Judgment and thought content normal.  Much improved today  Vitals reviewed.       Assessment & Plan:     1. Generalized anxiety disorder Improving. Will continue current medical treatment plan.  Medications refilled as below. She will switch celexa to take in the morning to see if that  helps with her drowsiness. I will see her back in 4 weeks if she continues to have the drowsiness even after the change.  If drowsiness continues we will consider switching medication. If drowsiness improves she may cancel the 4 week appointment and then I will see her back in 3 months.  - ALPRAZolam (XANAX) 1 MG tablet; Take 1 tablet (1 mg total) by mouth 3 (three) times daily as needed for anxiety.  Dispense: 60 tablet; Refill: 0 - citalopram (CELEXA) 10 MG tablet; Take 1 tablet (10 mg total) by mouth daily.  Dispense: 30 tablet; Refill: 3       Margaretann LovelessJennifer M Burnette, PA-C  [facility]

## 2015-07-30 ENCOUNTER — Encounter: Payer: Self-pay | Admitting: Physician Assistant

## 2015-08-17 ENCOUNTER — Encounter: Payer: Self-pay | Admitting: Physician Assistant

## 2015-08-19 ENCOUNTER — Ambulatory Visit: Payer: Self-pay | Admitting: Physician Assistant

## 2015-08-24 ENCOUNTER — Encounter: Payer: Self-pay | Admitting: Physician Assistant

## 2015-09-03 ENCOUNTER — Ambulatory Visit (INDEPENDENT_AMBULATORY_CARE_PROVIDER_SITE_OTHER): Payer: BC Managed Care – PPO | Admitting: Physician Assistant

## 2015-09-03 ENCOUNTER — Encounter: Payer: Self-pay | Admitting: Physician Assistant

## 2015-09-03 VITALS — BP 142/72 | HR 78 | Temp 98.1°F | Resp 14 | Wt 174.0 lb

## 2015-09-03 DIAGNOSIS — F419 Anxiety disorder, unspecified: Secondary | ICD-10-CM | POA: Diagnosis not present

## 2015-09-03 MED ORDER — ESCITALOPRAM OXALATE 10 MG PO TABS: 10.0000 mg | ORAL_TABLET | Freq: Every day | ORAL | Status: DC

## 2015-09-03 MED ORDER — LORAZEPAM 0.5 MG PO TABS: 0.5000 mg | ORAL_TABLET | Freq: Two times a day (BID) | ORAL | Status: DC | PRN

## 2015-09-03 NOTE — Progress Notes (Signed)
Patient ID: Alicia Dalton, female   DOB: 05-28-1970, 45 y.o.   MRN: [id number]       Patient: Alicia Dalton Female    DOB: [birth year]   45 y.o.   MRN: [id number] Visit Date: 09/03/2015  Today's Provider: Margaretann Loveless, PA-C   Chief Complaint  Patient presents with  . Anxiety   Subjective:    HPI Pt is here today for a follow up of anxiety. She reports that she has stopped taking the Celexa and is taking Xanax at least once every night. Depending on the day and her anxiety level and what she has going on she will take it more. She reports that she feels like the Xanax takes too long to calm her down. She reports that her blood pressure has never been as high as it is today. (she may need it checked later in the visit).  She is also seeing her Ob/Gyn today for her CPE and is going to ask them to check her hormone levels as she feels this may be early menopause symptoms.     No Known Allergies Previous Medications   ALPRAZOLAM (XANAX) 1 MG TABLET    Take 1 tablet (1 mg total) by mouth 3 (three) times daily as needed for anxiety.   CITALOPRAM (CELEXA) 10 MG TABLET    Take 1 tablet (10 mg total) by mouth daily.   NORETHINDRONE-ETHINYL ESTRADIOL 1/35 (NECON 1/35, 28,) TABLET       SUMATRIPTAN (IMITREX) 100 MG TABLET        Review of Systems  Constitutional: Negative.   HENT: Negative.   Eyes: Negative.   Respiratory: Negative.   Cardiovascular: Negative.   Gastrointestinal: Negative.   Endocrine: Negative.   Genitourinary: Negative.   Musculoskeletal: Negative.   Skin: Negative.   Allergic/Immunologic: Negative.   Neurological: Negative.   Hematological: Negative.   Psychiatric/Behavioral: Positive for dysphoric mood, decreased concentration and agitation. The patient is nervous/anxious.     Social History  Substance Use Topics  . Smoking status: Never Smoker   . Smokeless tobacco: Not on file  . Alcohol Use: Yes     Comment: 1-2 x wk-- none right now.      Objective:   BP 142/72 mmHg  Pulse 78  Temp(Src) 98.1 F (36.7 C) (Oral)  Resp 14  Wt 174 lb (78.926 kg)  Physical Exam  Constitutional: She appears well-developed and well-nourished. No distress.  Neck: Normal range of motion. Neck supple.  Cardiovascular: Normal rate, regular rhythm and normal heart sounds.  Exam reveals no gallop and no friction rub.   No murmur heard. Pulmonary/Chest: Effort normal and breath sounds normal. No respiratory distress. She has no wheezes. She has no rales.  Skin: She is not diaphoretic.  Psychiatric: Her speech is normal and behavior is normal. Judgment and thought content normal.  Frustrated and emotional because she "just feels crazy"  Vitals reviewed.       Assessment & Plan:     1. Acute anxiety Will switch from celexa and xanax to lexapro and ativan as below. She is getting her labs through her Ob/Gyn and will call us with those results. I will see her back in 4 weeks to see how she is doing and see if any improvement with these new medications. - LORazepam (ATIVAN) 0.5 MG tablet; Take 1 tablet (0.5 mg total) by mouth 2 (two) times daily as needed for anxiety.  Dispense: 60 tablet; Refill: 1 - escitalopram (LEXAPRO) 10 MG tablet;  Take 1 tablet (10 mg total) by mouth daily. Take 1/2 tab PO x 1 week, may increase to 1 tab after 1st week if needed  Dispense: 30 tablet; Refill: 1'      Margaretann LovelessJennifer M Alden Feagan, PA-C  [facility]

## 2015-09-03 NOTE — Patient Instructions (Signed)
Generalized Anxiety Disorder Generalized anxiety disorder (GAD) is a mental disorder. It interferes with life functions, including relationships, work, and school. GAD is different from normal anxiety, which everyone experiences at some point in their lives in response to specific life events and activities. Normal anxiety actually helps us prepare for and get through these life events and activities. Normal anxiety goes away after the event or activity is over.  GAD causes anxiety that is not necessarily related to specific events or activities. It also causes excess anxiety in proportion to specific events or activities. The anxiety associated with GAD is also difficult to control. GAD can vary from mild to severe. People with severe GAD can have intense waves of anxiety with physical symptoms (panic attacks).  SYMPTOMS The anxiety and worry associated with GAD are difficult to control. This anxiety and worry are related to many life events and activities and also occur more days than not for 6 months or longer. People with GAD also have three or more of the following symptoms (one or more in children):  Restlessness.   Fatigue.  Difficulty concentrating.   Irritability.  Muscle tension.  Difficulty sleeping or unsatisfying sleep. DIAGNOSIS GAD is diagnosed through an assessment by your health care provider. Your health care provider will ask you questions aboutyour mood,physical symptoms, and events in your life. Your health care provider may ask you about your medical history and use of alcohol or drugs, including prescription medicines. Your health care provider may also do a physical exam and blood tests. Certain medical conditions and the use of certain substances can cause symptoms similar to those associated with GAD. Your health care provider may refer you to a mental health specialist for further evaluation. TREATMENT The following therapies are usually used to treat GAD:    Medication. Antidepressant medication usually is prescribed for long-term daily control. Antianxiety medicines may be added in severe cases, especially when panic attacks occur.   Talk therapy (psychotherapy). Certain types of talk therapy can be helpful in treating GAD by providing support, education, and guidance. A form of talk therapy called cognitive behavioral therapy can teach you healthy ways to think about and react to daily life events and activities.  Stress managementtechniques. These include yoga, meditation, and exercise and can be very helpful when they are practiced regularly. A mental health specialist can help determine which treatment is best for you. Some people see improvement with one therapy. However, other people require a combination of therapies.   This information is not intended to replace advice given to you by your health care provider. Make sure you discuss any questions you have with your health care provider.   Document Released: 08/12/2012 Document Revised: 05/08/2014 Document Reviewed: 08/12/2012 Elsevier Interactive Patient Education 2016 Elsevier Inc.  

## 2015-09-14 ENCOUNTER — Encounter: Payer: Self-pay | Admitting: Physician Assistant

## 2015-09-20 ENCOUNTER — Encounter: Payer: Self-pay | Admitting: Physician Assistant

## 2015-09-20 DIAGNOSIS — F411 Generalized anxiety disorder: Secondary | ICD-10-CM

## 2015-09-20 MED ORDER — LORAZEPAM 1 MG PO TABS: 0.5000 mg | ORAL_TABLET | Freq: Two times a day (BID) | ORAL | Status: DC | PRN

## 2015-09-20 NOTE — Telephone Encounter (Signed)
Can we please figure out which pharmacy and call lorazepam 1mg  Take 1/2-1 tab PO BID prn anxiety. #60 1RF. Thanks.

## 2015-09-20 NOTE — Telephone Encounter (Signed)
[facility] [address]  Thanks,  -Joseline

## 2015-09-20 NOTE — Telephone Encounter (Signed)
LMTCB-RE: Pharmacy  Thanks,  -Lucky Alverson

## 2015-09-21 ENCOUNTER — Encounter: Payer: Self-pay | Admitting: Physician Assistant

## 2015-10-01 ENCOUNTER — Ambulatory Visit: Payer: Self-pay | Admitting: Physician Assistant

## 2015-10-07 ENCOUNTER — Encounter: Payer: Self-pay | Admitting: Physician Assistant

## 2015-10-08 ENCOUNTER — Ambulatory Visit: Payer: Self-pay | Admitting: Physician Assistant

## 2015-10-14 ENCOUNTER — Encounter: Payer: Self-pay | Admitting: Physician Assistant

## 2015-10-14 DIAGNOSIS — F32A Depression, unspecified: Secondary | ICD-10-CM

## 2015-10-14 DIAGNOSIS — F329 Major depressive disorder, single episode, unspecified: Secondary | ICD-10-CM

## 2015-10-14 DIAGNOSIS — F419 Anxiety disorder, unspecified: Secondary | ICD-10-CM

## 2015-11-10 ENCOUNTER — Ambulatory Visit (INDEPENDENT_AMBULATORY_CARE_PROVIDER_SITE_OTHER): Payer: BC Managed Care – PPO | Admitting: Licensed Clinical Social Worker

## 2015-11-10 DIAGNOSIS — F329 Major depressive disorder, single episode, unspecified: Secondary | ICD-10-CM | POA: Diagnosis not present

## 2015-11-10 DIAGNOSIS — F32A Depression, unspecified: Secondary | ICD-10-CM

## 2015-11-10 DIAGNOSIS — F411 Generalized anxiety disorder: Secondary | ICD-10-CM
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
## 2015-11-15 ENCOUNTER — Ambulatory Visit (INDEPENDENT_AMBULATORY_CARE_PROVIDER_SITE_OTHER): Payer: BC Managed Care – PPO | Admitting: Licensed Clinical Social Worker

## 2015-11-15 DIAGNOSIS — F329 Major depressive disorder, single episode, unspecified: Secondary | ICD-10-CM

## 2015-11-15 DIAGNOSIS — F32A Depression, unspecified: Secondary | ICD-10-CM

## 2015-11-15 DIAGNOSIS — F411 Generalized anxiety disorder: Secondary | ICD-10-CM | POA: Diagnosis not present
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
## 2015-11-19 ENCOUNTER — Encounter: Payer: Self-pay | Admitting: Physician Assistant

## 2015-11-19 DIAGNOSIS — G43009 Migraine without aura, not intractable, without status migrainosus: Secondary | ICD-10-CM

## 2015-11-22 ENCOUNTER — Other Ambulatory Visit: Payer: Self-pay | Admitting: Physician Assistant

## 2015-11-22 ENCOUNTER — Ambulatory Visit: Payer: BC Managed Care – PPO | Admitting: Licensed Clinical Social Worker

## 2015-11-22 DIAGNOSIS — F411 Generalized anxiety disorder: Secondary | ICD-10-CM

## 2015-11-22 MED ORDER — SUMATRIPTAN SUCCINATE 100 MG PO TABS: 100.0000 mg | ORAL_TABLET | ORAL | 5 refills | Status: DC | PRN

## 2015-11-22 NOTE — Telephone Encounter (Signed)
Was this called in? Sorry hard to tell with new epic. Once called in it can be closed. Thanks.

## 2015-11-23 ENCOUNTER — Other Ambulatory Visit: Payer: Self-pay

## 2015-11-23 DIAGNOSIS — F411 Generalized anxiety disorder: Secondary | ICD-10-CM

## 2015-11-23 MED ORDER — LORAZEPAM 1 MG PO TABS: 0.5000 mg | ORAL_TABLET | Freq: Two times a day (BID) | ORAL | 3 refills | Status: DC | PRN

## 2015-12-01 ENCOUNTER — Telehealth: Payer: Self-pay | Admitting: Physician Assistant

## 2015-12-01 NOTE — Telephone Encounter (Signed)
LMTCB  Thanks,  -Deetya Drouillard 

## 2015-12-01 NOTE — Telephone Encounter (Signed)
Pt is requesting a call back to discuss a referral to see a new therapist.  CB#[phone]/MW

## 2015-12-02 NOTE — Telephone Encounter (Signed)
LMTCB  Thanks,  -Sirus Labrie 

## 2015-12-03 ENCOUNTER — Encounter: Payer: Self-pay | Admitting: Physician Assistant

## 2015-12-06 NOTE — Telephone Encounter (Signed)
Closing chart. No answer from patient. Left message to call back or to reach us through my chart like before.   Thanks,  -Ajit Errico

## 2015-12-10 NOTE — Telephone Encounter (Signed)
Alicia SagoSarah,  Do you know what protocol is for a patient to switch counselors? Patient has seen Alicia Dalton but did not have a good rapport, I am guessing, because she is wishing to switch? Can they switch to another counselor within that group? Can she just call and schedule with someone other than Alicia Dalton?

## 2015-12-15 ENCOUNTER — Telehealth: Payer: Self-pay | Admitting: Physician Assistant

## 2015-12-15 NOTE — Telephone Encounter (Signed)
[facility]00

## 2015-12-30 ENCOUNTER — Ambulatory Visit: Payer: BC Managed Care – PPO | Admitting: Licensed Clinical Social Worker

## 2016-01-12 ENCOUNTER — Other Ambulatory Visit: Payer: Self-pay | Admitting: Physician Assistant

## 2016-01-12 DIAGNOSIS — F419 Anxiety disorder, unspecified: Secondary | ICD-10-CM

## 2016-01-19 ENCOUNTER — Ambulatory Visit: Payer: BC Managed Care – PPO | Admitting: Licensed Clinical Social Worker

## 2016-03-02 ENCOUNTER — Ambulatory Visit
Admission: RE | Admit: 2016-03-02 | Discharge: 2016-03-02 | Disposition: A | Discharge status: Home / Self Care | Payer: BC Managed Care – PPO | Source: Ambulatory Visit | Attending: Physician Assistant | Admitting: Physician Assistant

## 2016-03-02 ENCOUNTER — Ambulatory Visit (INDEPENDENT_AMBULATORY_CARE_PROVIDER_SITE_OTHER): Payer: BC Managed Care – PPO | Admitting: Physician Assistant

## 2016-03-02 ENCOUNTER — Telehealth: Payer: Self-pay

## 2016-03-02 ENCOUNTER — Encounter: Payer: Self-pay | Admitting: Physician Assistant

## 2016-03-02 ENCOUNTER — Other Ambulatory Visit: Payer: Self-pay | Admitting: Physician Assistant

## 2016-03-02 ENCOUNTER — Telehealth: Payer: Self-pay | Admitting: Physician Assistant

## 2016-03-02 VITALS — BP 92/60 | HR 84 | Temp 98.8°F | Resp 16 | Wt 164.0 lb

## 2016-03-02 DIAGNOSIS — R059 Cough, unspecified: Secondary | ICD-10-CM

## 2016-03-02 DIAGNOSIS — R05 Cough: Secondary | ICD-10-CM | POA: Diagnosis not present

## 2016-03-02 DIAGNOSIS — M791 Myalgia, unspecified site: Secondary | ICD-10-CM

## 2016-03-02 MED ORDER — AZITHROMYCIN 250 MG PO TABS
ORAL_TABLET | ORAL | 0 refills | Status: DC
Start: 2016-03-02 — End: 2016-03-06

## 2016-03-02 MED ORDER — HYDROCODONE-HOMATROPINE 5-1.5 MG/5ML PO SYRP: 5.0000 mL | ORAL_SOLUTION | Freq: Three times a day (TID) | ORAL | 0 refills | Status: DC | PRN

## 2016-03-02 NOTE — Telephone Encounter (Signed)
Patient has been advised and appt arranged. KW 

## 2016-03-02 NOTE — Telephone Encounter (Signed)
rx called to [facility]

## 2016-03-02 NOTE — Telephone Encounter (Signed)
Spoke with patient about CXR. Radiologist could not exclude perihilar infiltrate and in context of her symptoms, have advised her to fill Z-pack and start taking the abx today for possible pneumonia. Will have patient follow up Monday or Tuesday in clinic.

## 2016-03-02 NOTE — Patient Instructions (Signed)
Upper Respiratory Infection, Adult Most upper respiratory infections (URIs) are a viral infection of the air passages leading to the lungs. A URI affects the nose, throat, and upper air passages. The most common type of URI is nasopharyngitis and is typically referred to as "the common cold." URIs run their course and usually go away on their own. Most of the time, a URI does not require medical attention, but sometimes a bacterial infection in the upper airways can follow a viral infection. This is called a secondary infection. Sinus and middle ear infections are common types of secondary upper respiratory infections. Bacterial pneumonia can also complicate a URI. A URI can worsen asthma and chronic obstructive pulmonary disease (COPD). Sometimes, these complications can require emergency medical care and may be life threatening.  CAUSES Almost all URIs are caused by viruses. A virus is a type of germ and can spread from one person to another.  RISKS FACTORS You may be at risk for a URI if:   You smoke.   You have chronic heart or lung disease.  You have a weakened defense (immune) system.   You are very young or very old.   You have nasal allergies or asthma.  You work in crowded or poorly ventilated areas.  You work in health care facilities or schools. SIGNS AND SYMPTOMS  Symptoms typically develop 2-3 days after you come in contact with a cold virus. Most viral URIs last 7-10 days. However, viral URIs from the influenza virus (flu virus) can last 14-18 days and are typically more severe. Symptoms may include:   Runny or stuffy (congested) nose.   Sneezing.   Cough.   Sore throat.   Headache.   Fatigue.   Fever.   Loss of appetite.   Pain in your forehead, behind your eyes, and over your cheekbones (sinus pain).  Muscle aches.  DIAGNOSIS  Your health care provider may diagnose a URI by:  Physical exam.  Tests to check that your symptoms are not due to  another condition such as:  Strep throat.  Sinusitis.  Pneumonia.  Asthma. TREATMENT  A URI goes away on its own with time. It cannot be cured with medicines, but medicines may be prescribed or recommended to relieve symptoms. Medicines may help:  Reduce your fever.  Reduce your cough.  Relieve nasal congestion. HOME CARE INSTRUCTIONS   Take medicines only as directed by your health care provider.   Gargle warm saltwater or take cough drops to comfort your throat as directed by your health care provider.  Use a warm mist humidifier or inhale steam from a shower to increase air moisture. This may make it easier to breathe.  Drink enough fluid to keep your urine clear or pale yellow.   Eat soups and other clear broths and maintain good nutrition.   Rest as needed.   Return to work when your temperature has returned to normal or as your health care provider advises. You may need to stay home longer to avoid infecting others. You can also use a face mask and careful hand washing to prevent spread of the virus.  Increase the usage of your inhaler if you have asthma.   Do not use any tobacco products, including cigarettes, chewing tobacco, or electronic cigarettes. If you need help quitting, ask your health care provider. PREVENTION  The best way to protect yourself from getting a cold is to practice good hygiene.   Avoid oral or hand contact with people with cold   symptoms.   Wash your hands often if contact occurs.  There is no clear evidence that vitamin C, vitamin E, echinacea, or exercise reduces the chance of developing a cold. However, it is always recommended to get plenty of rest, exercise, and practice good nutrition.  SEEK MEDICAL CARE IF:   You are getting worse rather than better.   Your symptoms are not controlled by medicine.   You have chills.  You have worsening shortness of breath.  You have brown or red mucus.  You have yellow or brown nasal  discharge.  You have pain in your face, especially when you bend forward.  You have a fever.  You have swollen neck glands.  You have pain while swallowing.  You have white areas in the back of your throat. SEEK IMMEDIATE MEDICAL CARE IF:   You have severe or persistent:  Headache.  Ear pain.  Sinus pain.  Chest pain.  You have chronic lung disease and any of the following:  Wheezing.  Prolonged cough.  Coughing up blood.  A change in your usual mucus.  You have a stiff neck.  You have changes in your:  Vision.  Hearing.  Thinking.  Mood. MAKE SURE YOU:   Understand these instructions.  Will watch your condition.  Will get help right away if you are not doing well or get worse.   This information is not intended to replace advice given to you by your health care provider. Make sure you discuss any questions you have with your health care provider.   Document Released: 10/11/2000 Document Revised: 09/01/2014 Document Reviewed: 07/23/2013 Elsevier Interactive Patient Education 2016 [employer]  

## 2016-03-02 NOTE — Progress Notes (Signed)
[name]  Chief Complaint  Patient presents with  . URI    Subjective:    Patient ID: Alicia Dalton, female    DOB: January 04, 1971, 45 y.o.   MRN: [id number]  Upper Respiratory Infection: Alicia Dalton is a 45 y.o. female with a past complaining of symptoms of a URI. Symptoms include congestion, cough and diarrhea. Onset of symptoms was 2 days ago, gradually worsening since that time. She also c/o achiness, congestion, cough described as productive and productive cough with  Unsure colored sputum for the past 2 days .  She is drinking plenty of fluids. Evaluation to date: none. Treatment to date: none. The treatment has provided no. Patient had flu shot < 2 weeks ago. She reports multiple people at her work are sick.   Review of Systems  Constitutional: Positive for chills and fatigue. Negative for activity change, appetite change, diaphoresis, fever and unexpected weight change.  HENT: Positive for congestion. Negative for ear discharge, ear pain, nosebleeds, postnasal drip, rhinorrhea, sinus pressure, sneezing, sore throat and trouble swallowing.   Eyes: Negative.   Respiratory: Positive for cough, chest tightness and shortness of breath. Negative for apnea, choking, wheezing and stridor.   Cardiovascular: Negative.   Gastrointestinal: Positive for diarrhea (This has improved. ). Negative for abdominal distention, abdominal pain, anal bleeding, blood in stool, constipation, nausea, rectal pain and vomiting.  Neurological: Positive for light-headedness. Negative for dizziness and headaches.       Objective:   BP 92/60 (BP Location: Left Arm, Patient Position: Sitting, Cuff Size: Normal)   Pulse 84   Temp 98.8 F (37.1 C) (Oral)   Resp 16   Wt 164 lb (74.4 kg)   BMI 32.03 kg/m   Patient Active Problem List   Diagnosis Date Noted  . Headache, migraine 05/24/2015  . Arthritis of knee, degenerative 04/15/2014  . Complex tear of  medial meniscus as current injury 04/06/2014  . Current tear knee, medial meniscus 04/06/2014    Outpatient Encounter Prescriptions as of 03/02/2016  Medication Sig Note  . escitalopram (LEXAPRO) 10 MG tablet Take 1 tablet (10 mg total) by mouth at bedtime.   Marland Kitchen LORazepam (ATIVAN) 1 MG tablet Take 0.5-1 tablets (0.5-1 mg total) by mouth 2 (two) times daily as needed. for anxiety   . norethindrone-ethinyl estradiol 1/35 (NECON 1/35, 28,) tablet  05/24/2015: Received from: [facility]  . SUMAtriptan (IMITREX) 100 MG tablet Take 1 tablet (100 mg total) by mouth every 2 (two) hours as needed for migraine. Not to exceed 2 tabs per day.   Marland Kitchen azithromycin (ZITHROMAX) 250 MG tablet Two tablets on first day, one on the following first four days.   Marland Kitchen HYDROcodone-homatropine (HYCODAN) 5-1.5 MG/5ML syrup Take 5 mLs by mouth every 8 (eight) hours as needed for cough.    No facility-administered encounter medications on file as of 03/02/2016.     No Known Allergies     Physical Exam  Constitutional: She is oriented to person, place, and time. She appears well-developed and well-nourished. No distress.  HENT:  Nose: Nose normal.  Mouth/Throat: Oropharynx is clear and moist. No oropharyngeal exudate.  Eyes: Conjunctivae are normal.  Neck: Neck supple.  Cardiovascular: Normal rate and regular rhythm.   Pulmonary/Chest: Effort normal and breath sounds normal. No respiratory distress. She has no wheezes. She has no rales.  Lymphadenopathy:    She has no cervical adenopathy.  Neurological: She is alert and oriented to person, place, and time.  Skin: Skin is warm and dry. She is not diaphoretic.  Psychiatric: She has a normal mood and affect. Her behavior is normal.       Assessment & Plan:   Problem List Items Addressed This Visit    None    Visit Diagnoses    Cough    -  Primary   Relevant Medications   HYDROcodone-homatropine (HYCODAN) 5-1.5 MG/5ML syrup   azithromycin (ZITHROMAX) 250 MG  tablet   Other Relevant Orders   DG Chest 2 View   Myalgia       Relevant Orders   DG Chest 2 View      Patient is a 45 y/o female presenting with symptoms of respiratory infection. Rapid flu swab in office today was negative. Will get CXR to assess for pneumonia. Hard script given for Z-pack if images negative and patient does not feel better over the weekend. Cough medication given, advised that this is sedating and not to drive.   Recommend rest, fluids, frequent hand washing. Work note provided  Patient Instructions  Upper Respiratory Infection, Adult Most upper respiratory infections (URIs) are a viral infection of the air passages leading to the lungs. A URI affects the nose, throat, and upper air passages. The most common type of URI is nasopharyngitis and is typically referred to as "the common cold." URIs run their course and usually go away on their own. Most of the time, a URI does not require medical attention, but sometimes a bacterial infection in the upper airways can follow a viral infection. This is called a secondary infection. Sinus and middle ear infections are common types of secondary upper respiratory infections. Bacterial pneumonia can also complicate a URI. A URI can worsen asthma and chronic obstructive pulmonary disease (COPD). Sometimes, these complications can require emergency medical care and may be life threatening.  CAUSES Almost all URIs are caused by viruses. A virus is a type of germ and can spread from one person to another.  RISKS FACTORS You may be at risk for a URI if:   You smoke.   You have chronic heart or lung disease.  You have a weakened defense (immune) system.   You are very young or very old.   You have nasal allergies or asthma.  You work in crowded or poorly ventilated areas.  You work in health care facilities or schools. SIGNS AND SYMPTOMS  Symptoms typically develop 2-3 days after you come in contact with a cold virus. Most  viral URIs last 7-10 days. However, viral URIs from the influenza virus (flu virus) can last 14-18 days and are typically more severe. Symptoms may include:   Runny or stuffy (congested) nose.   Sneezing.   Cough.   Sore throat.   Headache.   Fatigue.   Fever.   Loss of appetite.   Pain in your forehead, behind your eyes, and over your cheekbones (sinus pain).  Muscle aches.  DIAGNOSIS  Your health care provider may diagnose a URI by:  Physical exam.  Tests to check that your symptoms are not due to another condition such as:  Strep throat.  Sinusitis.  Pneumonia.  Asthma. TREATMENT  A URI goes away on its own with time. It cannot be cured with medicines, but medicines may be prescribed or recommended to relieve symptoms. Medicines may help:  Reduce your fever.  Reduce your cough.  Relieve nasal congestion. HOME CARE INSTRUCTIONS   Take medicines only as directed by your health care provider.  Gargle warm saltwater or take cough drops to comfort your throat as directed by your health care provider.  Use a warm mist humidifier or inhale steam from a shower to increase air moisture. This may make it easier to breathe.  Drink enough fluid to keep your urine clear or pale yellow.   Eat soups and other clear broths and maintain good nutrition.   Rest as needed.   Return to work when your temperature has returned to normal or as your health care provider advises. You may need to stay home longer to avoid infecting others. You can also use a face mask and careful hand washing to prevent spread of the virus.  Increase the usage of your inhaler if you have asthma.   Do not use any tobacco products, including cigarettes, chewing tobacco, or electronic cigarettes. If you need help quitting, ask your health care provider. PREVENTION  The best way to protect yourself from getting a cold is to practice good hygiene.   Avoid oral or hand contact with  people with cold symptoms.   Wash your hands often if contact occurs.  There is no clear evidence that vitamin C, vitamin E, echinacea, or exercise reduces the chance of developing a cold. However, it is always recommended to get plenty of rest, exercise, and practice good nutrition.  SEEK MEDICAL CARE IF:   You are getting worse rather than better.   Your symptoms are not controlled by medicine.   You have chills.  You have worsening shortness of breath.  You have brown or red mucus.  You have yellow or brown nasal discharge.  You have pain in your face, especially when you bend forward.  You have a fever.  You have swollen neck glands.  You have pain while swallowing.  You have white areas in the back of your throat. SEEK IMMEDIATE MEDICAL CARE IF:   You have severe or persistent:  Headache.  Ear pain.  Sinus pain.  Chest pain.  You have chronic lung disease and any of the following:  Wheezing.  Prolonged cough.  Coughing up blood.  A change in your usual mucus.  You have a stiff neck.  You have changes in your:  Vision.  Hearing.  Thinking.  Mood. MAKE SURE YOU:   Understand these instructions.  Will watch your condition.  Will get help right away if you are not doing well or get worse.   This information is not intended to replace advice given to you by your health care provider. Make sure you discuss any questions you have with your health care provider.   Document Released: 10/11/2000 Document Revised: 09/01/2014 Document Reviewed: 07/23/2013 Elsevier Interactive Patient Education [employer]2016 Elsevier Inc.     The entirety of the information documented in the History of Present Illness, Review of Systems and Physical Exam were personally obtained by me. Portions of this information were initially documented by Kavin LeechLaura Roberth Berling, CMA and reviewed by me for thoroughness and accuracy.

## 2016-03-02 NOTE — Telephone Encounter (Signed)
-----   Message from Trey SailorsAdriana M Pollak, [place] sent at 03/02/2016  9:58 AM EDT ----- CXR showed that perihilar infiltrate could not be excluded. Have called patient and advised her to fill and start the Z-pack today. Please call patient to schedule a follow up appointment on either Monday or Tuesday of next week. Thank you.

## 2016-03-05 ENCOUNTER — Encounter: Payer: Self-pay | Admitting: Physician Assistant

## 2016-03-06 ENCOUNTER — Encounter: Payer: Self-pay | Admitting: Physician Assistant

## 2016-03-06 ENCOUNTER — Ambulatory Visit
Admission: RE | Admit: 2016-03-06 | Discharge: 2016-03-06 | Disposition: A | Discharge status: Home / Self Care | Payer: BC Managed Care – PPO | Source: Ambulatory Visit | Attending: Physician Assistant | Admitting: Physician Assistant

## 2016-03-06 ENCOUNTER — Ambulatory Visit (INDEPENDENT_AMBULATORY_CARE_PROVIDER_SITE_OTHER): Payer: BC Managed Care – PPO | Admitting: Physician Assistant

## 2016-03-06 VITALS — BP 116/70 | HR 68 | Temp 98.3°F | Resp 16 | Wt 161.0 lb

## 2016-03-06 DIAGNOSIS — R059 Cough, unspecified: Secondary | ICD-10-CM

## 2016-03-06 DIAGNOSIS — R0602 Shortness of breath: Secondary | ICD-10-CM

## 2016-03-06 DIAGNOSIS — M791 Myalgia, unspecified site: Secondary | ICD-10-CM

## 2016-03-06 DIAGNOSIS — R05 Cough: Secondary | ICD-10-CM | POA: Diagnosis not present

## 2016-03-06 MED ORDER — ALBUTEROL SULFATE HFA 108 (90 BASE) MCG/ACT IN AERS: 2.0000 | INHALATION_SPRAY | RESPIRATORY_TRACT | 0 refills | Status: AC | PRN

## 2016-03-06 MED ORDER — LEVALBUTEROL HCL 1.25 MG/0.5ML IN NEBU: 1.2500 mg | INHALATION_SOLUTION | Freq: Once | RESPIRATORY_TRACT | Status: AC

## 2016-03-06 MED ORDER — AMOXICILLIN-POT CLAVULANATE 875-125 MG PO TABS: 1.0000 | ORAL_TABLET | Freq: Two times a day (BID) | ORAL | 0 refills | Status: AC

## 2016-03-06 NOTE — Progress Notes (Signed)
Patient: Alicia Dalton Female    DOB: July 19, 1970   45 y.o.   MRN: [id number] Visit Date: 03/06/2016  Today's Provider: Trey Sailors, PA-C   Chief Complaint  Patient presents with  . Cough  . Shortness of Breath   Subjective:    Cough  This is a new problem. The current episode started 1 to 4 weeks ago. The problem has been unchanged. The cough is productive of sputum. Associated symptoms include headaches and shortness of breath. Pertinent negatives include no chills, ear pain, fever, postnasal drip, rhinorrhea, sore throat or wheezing. She has tried rest (Azithromycin, Hycodan) for the symptoms. The treatment provided no relief.  Shortness of Breath  This is a new problem. The current episode started in the past 7 days. The problem has been gradually worsening. Associated symptoms include headaches and sputum production. Pertinent negatives include no ear pain, fever, rhinorrhea, sore throat or wheezing. She has tried prescription cough suppressants for the symptoms. The treatment provided moderate relief.   Patient was seen in clinic on Thursday for cough and congestion. CXR could not rule out left perihilar infiltrate, and so patient took and completed Z-pack. Patient presenting today with worsening symptoms including cough, chest congestion, some pleuritic pain in her back. No fever. No leg swelling, long trips, hospitalization, surgery, active cancer. No flu exposure.     No Known Allergies   Current Outpatient Prescriptions:  .  escitalopram (LEXAPRO) 10 MG tablet, Take 1 tablet (10 mg total) by mouth at bedtime., Disp: 30 tablet, Rfl: 5 .  HYDROcodone-homatropine (HYCODAN) 5-1.5 MG/5ML syrup, Take 5 mLs by mouth every 8 (eight) hours as needed for cough., Disp: 120 mL, Rfl: 0 .  LORazepam (ATIVAN) 0.5 MG tablet, TAKE 1 TABLET BY MOUTH TWICE DAILY AS NEEDED FOR ANXIETY, Disp: 60 tablet, Rfl: 3 .  norethindrone-ethinyl estradiol 1/35 (NECON 1/35, 28,) tablet, ,  Disp: , Rfl:  .  SUMAtriptan (IMITREX) 100 MG tablet, Take 1 tablet (100 mg total) by mouth every 2 (two) hours as needed for migraine. Not to exceed 2 tabs per day., Disp: 10 tablet, Rfl: 5 .  albuterol (PROVENTIL HFA;VENTOLIN HFA) 108 (90 Base) MCG/ACT inhaler, Inhale 2 puffs into the lungs every 4 (four) hours as needed for wheezing or shortness of breath., Disp: 1 Inhaler, Rfl: 0 .  amoxicillin-clavulanate (AUGMENTIN) 875-125 MG tablet, Take 1 tablet by mouth 2 (two) times daily., Disp: 20 tablet, Rfl: 0  Current Facility-Administered Medications:  .  levalbuterol (XOPENEX) nebulizer solution 1.25 mg, 1.25 mg, Nebulization, Once, Trey Sailors, PA-C  Review of Systems  Constitutional: Positive for fatigue. Negative for activity change, appetite change, chills, diaphoresis, fever and unexpected weight change.  HENT: Negative for congestion, ear discharge, ear pain, nosebleeds, postnasal drip, rhinorrhea, sinus pain, sinus pressure, sneezing and sore throat.   Eyes: Negative.   Respiratory: Positive for cough, sputum production, chest tightness and shortness of breath. Negative for apnea, choking, wheezing and stridor.   Cardiovascular: Negative.   Gastrointestinal: Negative.   Musculoskeletal: Positive for back pain.  Neurological: Positive for light-headedness and headaches. Negative for dizziness.    Social History  Substance Use Topics  . Smoking status: Never Smoker  . Smokeless tobacco: Not on file  . Alcohol use Yes     Comment: 1-2 x wk-- none right now.    Objective:   BP 116/70 (BP Location: Left Arm, Patient Position: Sitting, Cuff Size: Normal)   Pulse 68   Temp  98.3 F (36.8 C) (Oral)   Resp 16   Wt 161 lb (73 kg)   SpO2 100%   BMI 31.44 kg/m   Physical Exam  Constitutional: She is oriented to person, place, and time. She appears well-developed and well-nourished.  Non-toxic appearance. She does not have a sickly appearance. She appears ill.  HENT:    Mouth/Throat: Oropharynx is clear and moist. No oropharyngeal exudate.  Eyes:  Tms opaque bilaterally  Neck: Normal range of motion. Neck supple.  Cardiovascular: Normal rate and regular rhythm.   Pulmonary/Chest: Effort normal and breath sounds normal. No respiratory distress. She has no wheezes.  Musculoskeletal: She exhibits no edema.  No bilateral lower extremity edema.  Lymphadenopathy:    She has no cervical adenopathy.  Neurological: She is alert and oriented to person, place, and time.  Skin: Skin is warm and dry.  Psychiatric: She has a normal mood and affect. Her behavior is normal.        Assessment & Plan:      Problem List Items Addressed This Visit    None    Visit Diagnoses    Cough    -  Primary   Relevant Medications   levalbuterol (XOPENEX) nebulizer solution 1.25 mg   albuterol (PROVENTIL HFA;VENTOLIN HFA) 108 (90 Base) MCG/ACT inhaler   amoxicillin-clavulanate (AUGMENTIN) 875-125 MG tablet   Other Relevant Orders   CBC with Differential   Shortness of breath       Relevant Medications   levalbuterol (XOPENEX) nebulizer solution 1.25 mg   albuterol (PROVENTIL HFA;VENTOLIN HFA) 108 (90 Base) MCG/ACT inhaler   amoxicillin-clavulanate (AUGMENTIN) 875-125 MG tablet   Myalgia         Patient is 45 y/o presenting with symptoms concerning for pneumonia. Course of azithromycin was not effective. Xopenex administered today in clinic with some improvement. Will get CXR today and CBC + diff. Will prescribe Augmentin for 7 days. Prescribed albuterol inhaler. Will contact patient about results but she should start taking her abx today. Her followup is on Wednesday.  The entirety of the information documented in the History of Present Illness, Review of Systems and Physical Exam were personally obtained by me. Portions of this information were initially documented by Kavin Leech, CMA and reviewed by me for thoroughness and accuracy.    Patient Instructions   Community-Acquired Pneumonia, Adult Pneumonia is an infection of the lungs. There are different types of pneumonia. One type can develop while a person is in a hospital. A different type, called community-acquired pneumonia, develops in people who are not, or have not recently been, in the hospital or other health care facility.  CAUSES Pneumonia may be caused by bacteria, viruses, or funguses. Community-acquired pneumonia is often caused by Streptococcus pneumonia bacteria. These bacteria are often passed from one person to another by breathing in droplets from the cough or sneeze of an infected person. RISK FACTORS The condition is more likely to develop in:  People who havechronic diseases, such as chronic obstructive pulmonary disease (COPD), asthma, congestive heart failure, cystic fibrosis, diabetes, or kidney disease.  People who haveearly-stage or late-stage HIV.  People who havesickle cell disease.  People who havehad their spleen removed (splenectomy).  People who havepoor Administrator.  People who havemedical conditions that increase the risk of breathing in (aspirating) secretions their own mouth and nose.   People who havea weakened immune system (immunocompromised).  People who smoke.  People whotravel to areas where pneumonia-causing germs commonly exist.  People whoare  around animal habitats or animals that have pneumonia-causing germs, including birds, bats, rabbits, cats, and farm animals. SYMPTOMS Symptoms of this condition include:  Adry cough.  A wet (productive) cough.  Fever.  Sweating.  Chest pain, especially when breathing deeply or coughing.  Rapid breathing or difficulty breathing.  Shortness of breath.  Shaking chills.  Fatigue.  Muscle aches. DIAGNOSIS Your health care provider will take a medical history and perform a physical exam. You may also have other tests, including:  Imaging studies of your chest, including  X-rays.  Tests to check your blood oxygen level and other blood gases.  Other tests on blood, mucus (sputum), fluid around your lungs (pleural fluid), and urine. If your pneumonia is severe, other tests may be done to identify the specific cause of your illness. TREATMENT The type of treatment that you receive depends on many factors, such as the cause of your pneumonia, the medicines you take, and other medical conditions that you have. For most adults, treatment and recovery from pneumonia may occur at home. In some cases, treatment must happen in a hospital. Treatment may include:  Antibiotic medicines, if the pneumonia was caused by bacteria.  Antiviral medicines, if the pneumonia was caused by a virus.  Medicines that are given by mouth or through an IV tube.  Oxygen.  Respiratory therapy. Although rare, treating severe pneumonia may include:  Mechanical ventilation. This is done if you are not breathing well on your own and you cannot maintain a safe blood oxygen level.  Thoracentesis. This procedureremoves fluid around one lung or both lungs to help you breathe better. HOME CARE INSTRUCTIONS  Take over-the-counter and prescription medicines only as told by your health care provider.  Only takecough medicine if you are losing sleep. Understand that cough medicine can prevent your body's natural ability to remove mucus from your lungs.  If you were prescribed an antibiotic medicine, take it as told by your health care provider. Do not stop taking the antibiotic even if you start to feel better.  Sleep in a semi-upright position at night. Try sleeping in a reclining chair, or place a few pillows under your head.  Do not use tobacco products, including cigarettes, chewing tobacco, and e-cigarettes. If you need help quitting, ask your health care provider.  Drink enough water to keep your urine clear or pale yellow. This will help to thin out mucus secretions in your  lungs. PREVENTION There are ways that you can decrease your risk of developing community-acquired pneumonia. Consider getting a pneumococcal vaccine if:  You are older than 46 years of age.  You are older than 46 years of age and are undergoing cancer treatment, have chronic lung disease, or have other medical conditions that affect your immune system. Ask your health care provider if this applies to you. There are different types and schedules of pneumococcal vaccines. Ask your health care provider which vaccination option is best for you. You may also prevent community-acquired pneumonia if you take these actions:  Get an influenza vaccine every year. Ask your health care provider which type of influenza vaccine is best for you.  Go to the dentist on a regular basis.  Wash your hands often. Use hand sanitizer if soap and water are not available. SEEK MEDICAL CARE IF:  You have a fever.  You are losing sleep because you cannot control your cough with cough medicine. SEEK IMMEDIATE MEDICAL CARE IF:  You have worsening shortness of breath.  You have increased  chest pain.  Your sickness becomes worse, especially if you are an older adult or have a weakened immune system.  You cough up blood.   This information is not intended to replace advice given to you by your health care provider. Make sure you discuss any questions you have with your health care provider.   Document Released: 04/17/2005 Document Revised: 01/06/2015 Document Reviewed: 08/12/2014 Elsevier Interactive Patient Education [employer]2016 Elsevier Inc.   Return for pt has follow up on Wednesday .       Trey SailorsAdriana M Pollak, PA-C  [facility]

## 2016-03-06 NOTE — Patient Instructions (Signed)

## 2016-03-07 ENCOUNTER — Telehealth: Payer: Self-pay

## 2016-03-07 LAB — CBC WITH DIFFERENTIAL/PLATELET
Basophils Absolute: 0 10*3/uL (ref 0.0–0.2)
Basos: 0 %
EOS (ABSOLUTE): 0 10*3/uL (ref 0.0–0.4)
Eos: 0 %
Hematocrit: 41.9 % (ref 34.0–46.6)
Hemoglobin: 14 g/dL (ref 11.1–15.9)
Immature Grans (Abs): 0 10*3/uL (ref 0.0–0.1)
Immature Granulocytes: 0 %
Lymphocytes Absolute: 3.9 10*3/uL — ABNORMAL HIGH (ref 0.7–3.1)
Lymphs: 35 %
MCH: 31.2 pg (ref 26.6–33.0)
MCHC: 33.4 g/dL (ref 31.5–35.7)
MCV: 93 fL (ref 79–97)
Monocytes Absolute: 0.5 10*3/uL (ref 0.1–0.9)
Monocytes: 5 %
Neutrophils Absolute: 6.8 10*3/uL (ref 1.4–7.0)
Neutrophils: 60 %
Platelets: 334 10*3/uL (ref 150–379)
RBC: 4.49 x10E6/uL (ref 3.77–5.28)
RDW: 13.2 % (ref 12.3–15.4)
WBC: 11.2 10*3/uL — ABNORMAL HIGH (ref 3.4–10.8)

## 2016-03-07 NOTE — Telephone Encounter (Signed)
Patient has been advised of labs and x-ray report. KW

## 2016-03-07 NOTE — Telephone Encounter (Signed)
-----   Message from Trey SailorsAdriana M Pollak, [place]-C sent at 03/07/2016  8:29 AM EST ----- CXR read as clear with no active cardiopulmonary disease. In combination with CBC indicating viral infection, patient may stop taking her antibiotics if she wishes.

## 2016-03-07 NOTE — Telephone Encounter (Signed)
-----   Message from Trey SailorsAdriana M Pollak, [place]-C sent at 03/07/2016  8:28 AM EST ----- White count elevated, consistent with infection. White cell distribution consistent with viral infection. Patient may stop taking her antibiotics if she wishes. Unfortunately, no treatment for this and virus will have to run this course.

## 2016-03-07 NOTE — Telephone Encounter (Signed)
[id number] 

## 2016-03-08 ENCOUNTER — Ambulatory Visit: Payer: Self-pay | Admitting: Physician Assistant

## 2016-03-10 ENCOUNTER — Other Ambulatory Visit: Payer: Self-pay | Admitting: Physician Assistant

## 2016-03-10 NOTE — Telephone Encounter (Signed)
RX called in-aa 

## 2016-03-10 NOTE — Telephone Encounter (Signed)
Pt contacted office for refill request on the following medications: LORazepam (ATIVAN) 1 MG tablet  Pt stated that she has been taking 1 mg and not .5 mg [facility] [address] Pt would like LORazepam (ATIVAN) 1 MG tablet  Called into the pharmacy. Please advise. Thanks TNP

## 2016-03-10 NOTE — Telephone Encounter (Signed)
Please review-aa 

## 2016-04-06 ENCOUNTER — Other Ambulatory Visit: Payer: Self-pay | Admitting: Physician Assistant

## 2016-04-06 NOTE — Telephone Encounter (Signed)
[facility] Allene DillonEmily Drozdowski, CMA

## 2016-04-07 NOTE — Telephone Encounter (Signed)
Called into [facility]. Emily Drozdowski, CMA  

## 2016-04-17 ENCOUNTER — Telehealth: Payer: Self-pay

## 2016-04-17 ENCOUNTER — Encounter: Payer: Self-pay | Admitting: Physician Assistant

## 2016-04-17 NOTE — Telephone Encounter (Signed)
Merrily PewHi Jennifer,   The sent the info to me. I have attached for your review and included my release of information. You may fax the info directly to them once completed please. [phone]. I appreciate your time and hope you are well.

## 2016-04-28 ENCOUNTER — Ambulatory Visit (INDEPENDENT_AMBULATORY_CARE_PROVIDER_SITE_OTHER): Payer: BC Managed Care – PPO | Admitting: Family Medicine

## 2016-04-28 ENCOUNTER — Encounter: Payer: Self-pay | Admitting: Family Medicine

## 2016-04-28 VITALS — BP 112/62 | HR 78 | Temp 99.0°F | Resp 16 | Wt 171.0 lb

## 2016-04-28 DIAGNOSIS — R05 Cough: Secondary | ICD-10-CM | POA: Diagnosis not present

## 2016-04-28 DIAGNOSIS — J40 Bronchitis, not specified as acute or chronic: Secondary | ICD-10-CM

## 2016-04-28 DIAGNOSIS — R059 Cough, unspecified: Secondary | ICD-10-CM

## 2016-04-28 MED ORDER — HYDROCODONE-HOMATROPINE 5-1.5 MG/5ML PO SYRP: 5.0000 mL | ORAL_SOLUTION | Freq: Every evening | ORAL | 0 refills | Status: AC | PRN

## 2016-04-28 MED ORDER — FLUCONAZOLE 150 MG PO TABS: 150.0000 mg | ORAL_TABLET | Freq: Once | ORAL | 0 refills | Status: AC

## 2016-04-28 MED ORDER — DOXYCYCLINE HYCLATE 100 MG PO TABS: 100.0000 mg | ORAL_TABLET | Freq: Two times a day (BID) | ORAL | 0 refills | Status: AC

## 2016-04-28 NOTE — Progress Notes (Signed)
Patient: Alicia Dalton Female    DOB: [birth year]   45 y.o.   MRN: [id number] Visit Date: 04/28/2016  Today's Provider: Mila Merryonald Zaidyn Claire, MD   Chief Complaint  Patient presents with  . Cough   Subjective:    Cough  The current episode started yesterday. The problem has been gradually worsening. The problem occurs constantly. The cough is productive of sputum (green in color ). Associated symptoms include chills, headaches, postnasal drip and a sore throat. The symptoms are aggravated by cold air. She has tried nothing for the symptoms. There is no history of asthma or environmental allergies.   Patient reports that her symptoms came on all of a sudden. She reports that she had similar symptoms about 1 month ago, and she was treated for pneumonia which had resolved.     No Known Allergies   Current Outpatient Prescriptions:  .  albuterol (PROVENTIL HFA;VENTOLIN HFA) 108 (90 Base) MCG/ACT inhaler, Inhale 2 puffs into the lungs every 4 (four) hours as needed for wheezing or shortness of breath., Disp: 1 Inhaler, Rfl: 0 .  ALPRAZolam (XANAX) 0.5 MG tablet, TAKE 1/2 TO 1 TABLET BY MOUTH TWICE DAILY AS NEEDED FOR PANIC ATTACK AND TAKE 1 TABLET BY MOUTH EVERY NIGHT AT BEDTIME AS NEEDED FOR SLEEP, Disp: 60 tablet, Rfl: 1 .  escitalopram (LEXAPRO) 10 MG tablet, Take 1 tablet (10 mg total) by mouth at bedtime., Disp: 30 tablet, Rfl: 5 .  LORazepam (ATIVAN) 1 MG tablet, TAKE 1/2 OR 1 TABLET BY MOUTH TWICE DAILY AS NEEDED, Disp: 60 tablet, Rfl: 3 .  norethindrone-ethinyl estradiol 1/35 (NECON 1/35, 28,) tablet, , Disp: , Rfl:  .  SUMAtriptan (IMITREX) 100 MG tablet, Take 1 tablet (100 mg total) by mouth every 2 (two) hours as needed for migraine. Not to exceed 2 tabs per day., Disp: 10 tablet, Rfl: 5 .  HYDROcodone-homatropine (HYCODAN) 5-1.5 MG/5ML syrup, Take 5 mLs by mouth every 8 (eight) hours as needed for cough. (Patient not taking: Reported on 04/28/2016), Disp: 120 mL, Rfl:  0  Current Facility-Administered Medications:  .  levalbuterol (XOPENEX) nebulizer solution 1.25 mg, 1.25 mg, Nebulization, Once, Adriana M Pollak, PA-C  Review of Systems  Constitutional: Positive for activity change, appetite change, chills and fatigue.  HENT: Positive for congestion, postnasal drip, sneezing and sore throat.   Respiratory: Positive for cough.   Allergic/Immunologic: Negative for environmental allergies.  Neurological: Positive for headaches.    Social History  Substance Use Topics  . Smoking status: Never Smoker  . Smokeless tobacco: Not on file  . Alcohol use Yes     Comment: 1-2 x wk-- none right now.    Objective:   BP 112/62 (BP Location: Left Arm, Patient Position: Sitting, Cuff Size: Normal)   Pulse 78   Temp 99 F (37.2 C)   Resp 16   Wt 171 lb (77.6 kg)   SpO2 98%   BMI 33.40 kg/m   Physical Exam  General Appearance:    Alert, cooperative, no distress  HENT:   bilateral TM normal without fluid or infection, neck without nodes, throat normal without erythema or exudate and sinuses nontender  Eyes:    PERRL, conjunctiva/corneas clear, EOM's intact       Lungs:     Occasional expiratory wheeze, no rales, , respirations unlabored  Heart:    Regular rate and rhythm  Neurologic:   Awake, alert, oriented x 3. No apparent focal neurological  defect.           Assessment & Plan:     1. Cough  - HYDROcodone-homatropine (HYCODAN) 5-1.5 MG/5ML syrup; Take 5 mLs by mouth at bedtime as needed for cough.  Dispense: 120 mL; Refill: 0  2. Bronchitis  - doxycycline (VIBRA-TABS) 100 MG tablet; Take 1 tablet (100 mg total) by mouth 2 (two) times daily.  Dispense: 20 tablet; Refill: 0  Call if symptoms change or if not rapidly improving.         The entirety of the information documented in the History of Present Illness, Review of Systems and Physical Exam were personally obtained by me. Portions of this information were initially documented  by Anson [place]achelle Presley, CMA and reviewed by me for thoroughness and accuracy.    Mila Merryonald Lilia Letterman, MD  [facility]

## 2016-06-29 ENCOUNTER — Other Ambulatory Visit: Payer: Self-pay | Admitting: Physician Assistant

## 2016-06-29 DIAGNOSIS — G43009 Migraine without aura, not intractable, without status migrainosus: Secondary | ICD-10-CM

## 2016-06-29 DIAGNOSIS — F419 Anxiety disorder, unspecified: Secondary | ICD-10-CM

## 2016-07-12 ENCOUNTER — Other Ambulatory Visit: Payer: Self-pay | Admitting: Physician Assistant

## 2016-07-12 DIAGNOSIS — F419 Anxiety disorder, unspecified: Secondary | ICD-10-CM

## 2016-07-14 ENCOUNTER — Encounter: Payer: Self-pay | Admitting: Physician Assistant

## 2017-03-06 IMAGING — CR DG CHEST 2V
1 series · 2 of 2 positions shown · non-contrast
Comparison: No recent prior.

CLINICAL DATA: Cough and congestion.

EXAM:
CHEST  2 VIEW

[Series 1: dg chest 2 view · 0.14mm/px · 2 of 2 slices shown]
[im 1/2]
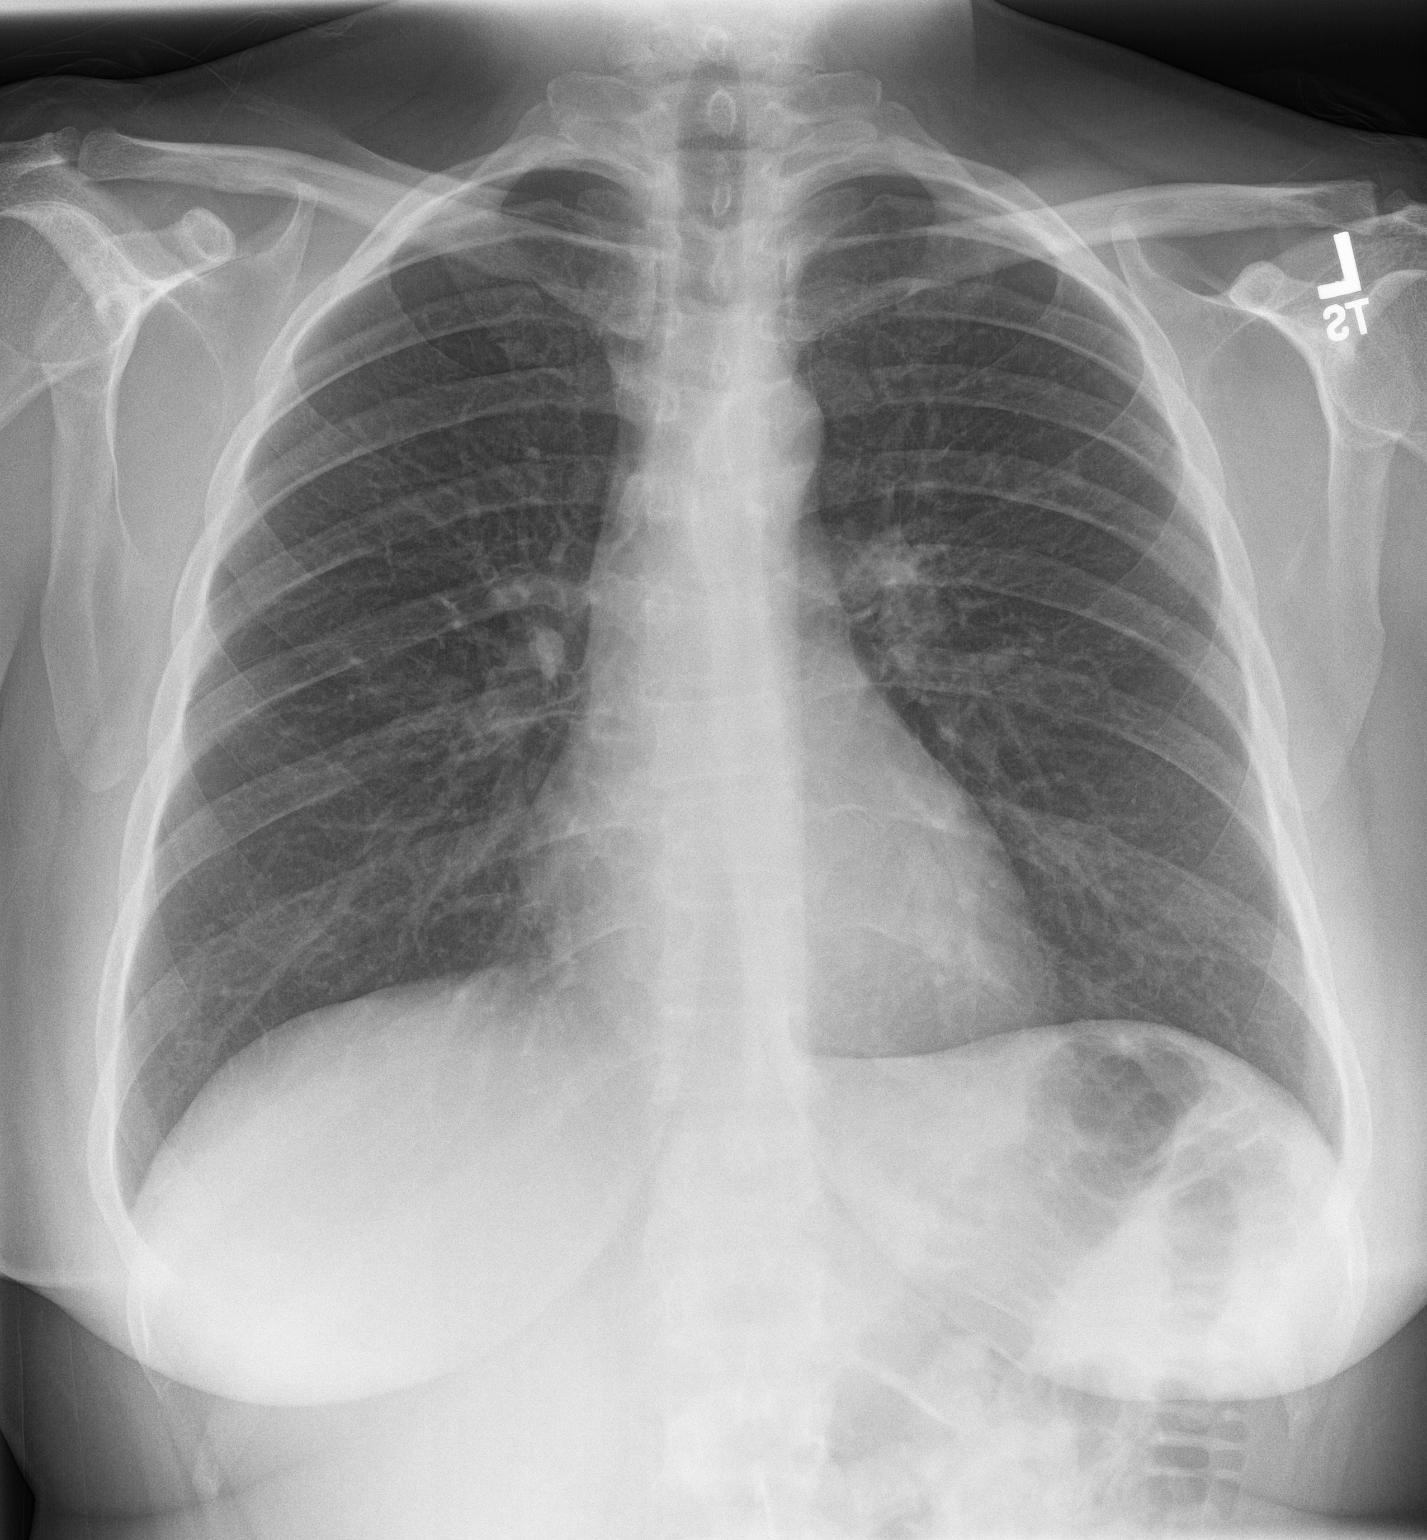
[im 2/2]
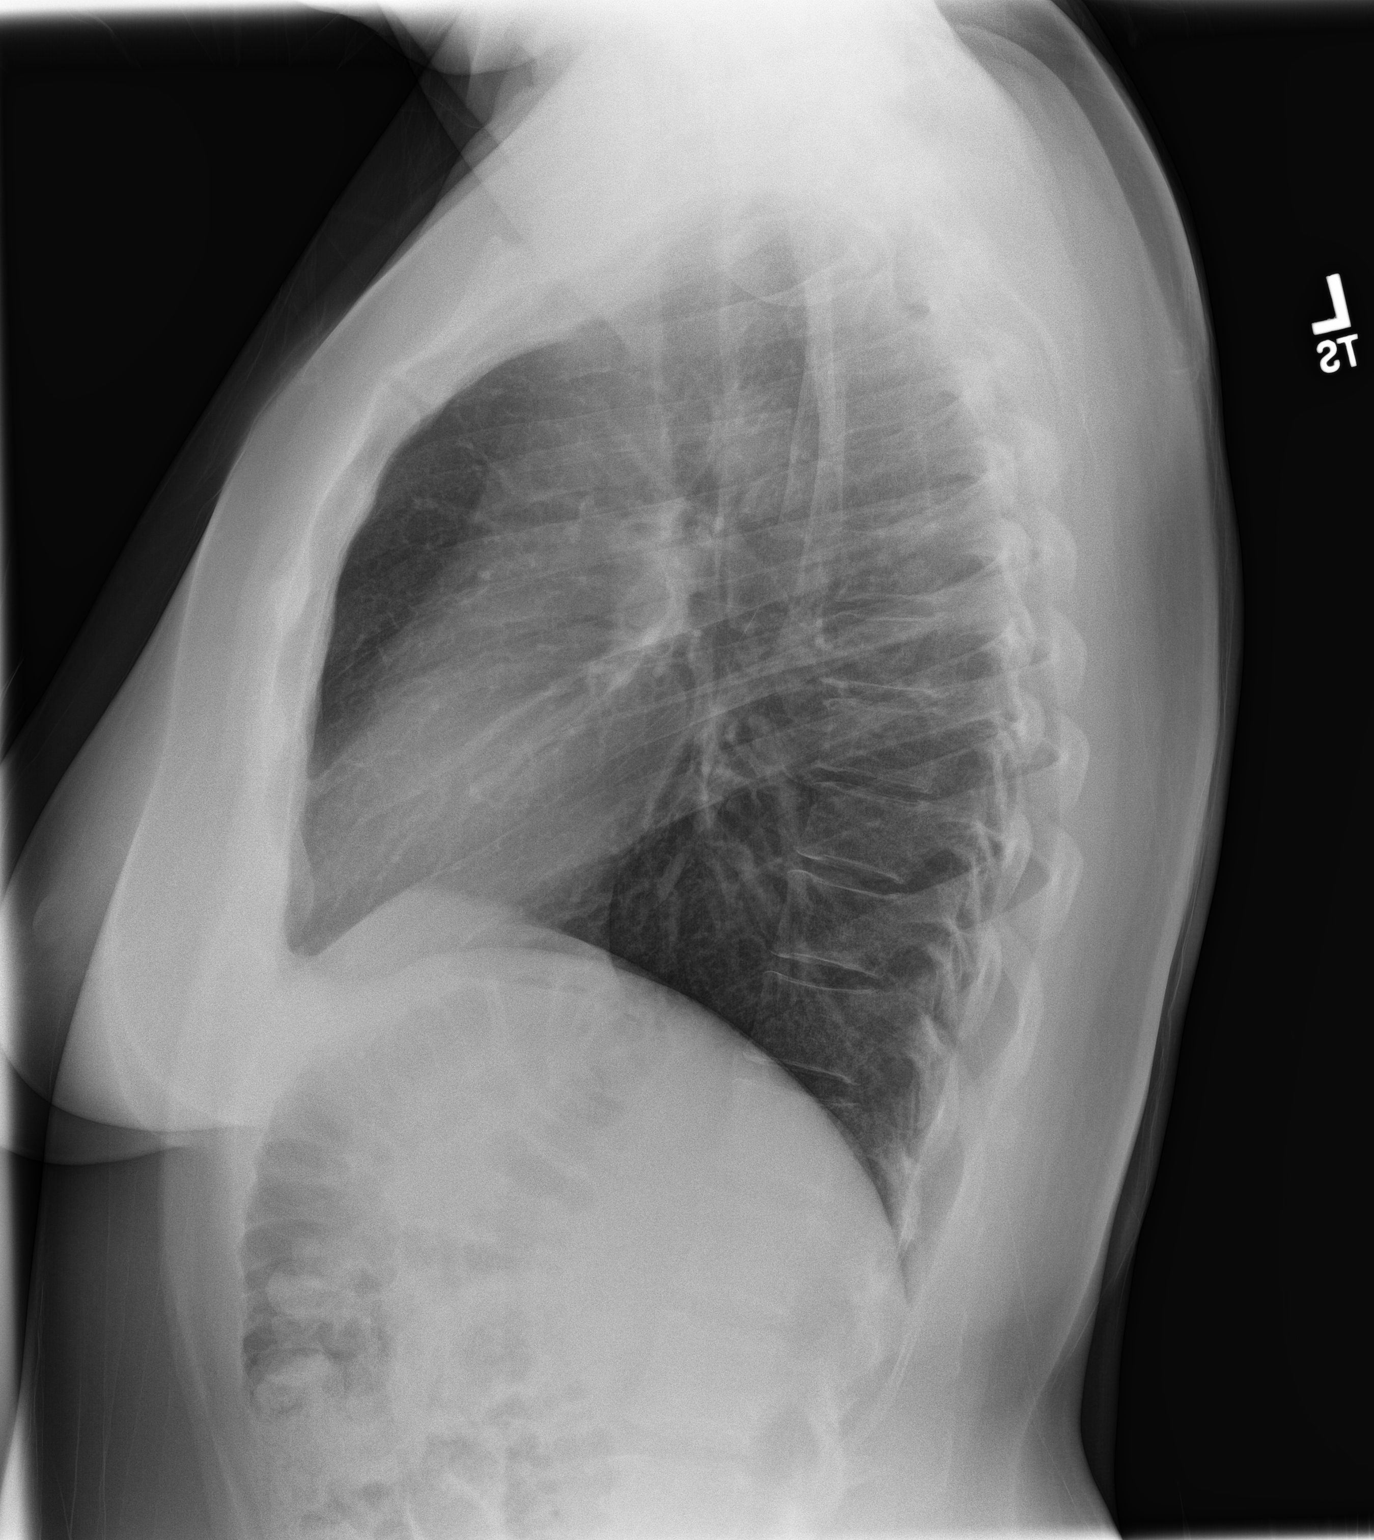

[2 of 2 positions shown; findings below may reference images not displayed]

FINDINGS: Mediastinum and hilar structures are normal. Mild left perihilar
infiltrate cannot be excluded. No pleural effusion or pneumothorax.
IMPRESSION: Mild left perihilar infiltrate cannot be excluded.

## 2019-08-25 ENCOUNTER — Other Ambulatory Visit (INDEPENDENT_AMBULATORY_CARE_PROVIDER_SITE_OTHER): Payer: Self-pay | Admitting: Family Medicine

## 2019-09-10 ENCOUNTER — Other Ambulatory Visit (INDEPENDENT_AMBULATORY_CARE_PROVIDER_SITE_OTHER): Payer: Self-pay | Admitting: Family Medicine

## 2020-02-27 ENCOUNTER — Other Ambulatory Visit (INDEPENDENT_AMBULATORY_CARE_PROVIDER_SITE_OTHER): Payer: Self-pay | Admitting: Family Medicine

## 2020-12-20 ENCOUNTER — Ambulatory Visit
Admission: RE | Admit: 2020-12-20 | Discharge: 2020-12-20 | Disposition: A | Discharge status: Home / Self Care | Payer: BC Managed Care – PPO | Source: Ambulatory Visit | Attending: Student | Admitting: Student

## 2020-12-20 ENCOUNTER — Other Ambulatory Visit: Payer: Self-pay

## 2020-12-20 ENCOUNTER — Other Ambulatory Visit: Payer: Self-pay | Admitting: Student

## 2020-12-20 ENCOUNTER — Other Ambulatory Visit
Admission: RE | Admit: 2020-12-20 | Discharge: 2020-12-20 | Disposition: A | Discharge status: Home / Self Care | Payer: BC Managed Care – PPO | Source: Ambulatory Visit | Attending: *Deleted | Admitting: *Deleted

## 2020-12-20 DIAGNOSIS — R7989 Other specified abnormal findings of blood chemistry: Secondary | ICD-10-CM | POA: Insufficient documentation

## 2020-12-20 DIAGNOSIS — R0602 Shortness of breath: Secondary | ICD-10-CM

## 2020-12-20 LAB — D-DIMER, QUANTITATIVE: D-Dimer, Quant: 0.84 ug/mL-FEU — ABNORMAL HIGH (ref 0.00–0.50)

## 2020-12-20 MED ORDER — IOHEXOL 350 MG/ML SOLN
100.0000 mL | Freq: Once | INTRAVENOUS | Status: AC | PRN
  Administered 2020-12-20: 75 mL via INTRAVENOUS
